# Patient Record
Sex: Female | Born: 1980 | Race: White | Hispanic: Yes | Marital: Married | Smoking: Never smoker
Health system: Southern US, Community
[De-identification: ages and names within clinical notes are randomized; demographics above are authoritative.]

## PROBLEM LIST (undated history)

## (undated) DIAGNOSIS — R87619 Unspecified abnormal cytological findings in specimens from cervix uteri: Secondary | ICD-10-CM

## (undated) DIAGNOSIS — Z87440 Personal history of urinary (tract) infections: Secondary | ICD-10-CM

## (undated) DIAGNOSIS — O24419 Gestational diabetes mellitus in pregnancy, unspecified control: Secondary | ICD-10-CM

## (undated) DIAGNOSIS — IMO0002 Reserved for concepts with insufficient information to code with codable children: Secondary | ICD-10-CM

## (undated) DIAGNOSIS — R011 Cardiac murmur, unspecified: Secondary | ICD-10-CM

## (undated) DIAGNOSIS — R87629 Unspecified abnormal cytological findings in specimens from vagina: Secondary | ICD-10-CM

## (undated) HISTORY — DX: Cardiac murmur, unspecified: R01.1

## (undated) HISTORY — DX: Gestational diabetes mellitus in pregnancy, unspecified control: O24.419

## (undated) HISTORY — DX: Personal history of urinary (tract) infections: Z87.440

## (undated) HISTORY — DX: Unspecified abnormal cytological findings in specimens from vagina: R87.629

## (undated) HISTORY — PX: NO PAST SURGERIES: SHX2092

---

## 2011-06-25 NOTE — L&D Delivery Note (Signed)
Delivery Note At 3:08 AM a viable female was delivered via Vaginal, Spontaneous Delivery (Presentation: Right Occiput Anterior).  APGAR: 8, ; weight .   Placenta status: Intact, Spontaneous.  Cord: 3 vessels with the following complications: None.   Anesthesia: None  Episiotomy: None Lacerations: None Suture Repair: 2.0 vicryl Est. Blood Loss (mL): 500  Mom to postpartum.  Baby to mother's skin and stable.  Felix Pacini 04/14/2012, 3:42 AM

## 2011-06-25 NOTE — L&D Delivery Note (Signed)
Attestation of Attending Supervision of Advanced Practitioner (CNM/NP): Evaluation and management procedures were performed by the Advanced Practitioner under my supervision and collaboration.  I have reviewed the Advanced Practitioner's note and chart, and I agree with the management and plan.  Aven Christen 04/14/2012 5:25 AM

## 2011-12-04 ENCOUNTER — Other Ambulatory Visit (HOSPITAL_COMMUNITY): Payer: Self-pay | Admitting: Physician Assistant

## 2011-12-04 DIAGNOSIS — Z3689 Encounter for other specified antenatal screening: Secondary | ICD-10-CM

## 2011-12-04 LAB — OB RESULTS CONSOLE ABO/RH

## 2011-12-04 LAB — HEMOGLOBIN EVAL RFX ELECTROPHORESIS: Hemoglobin Evaluation: NORMAL

## 2011-12-04 LAB — OB RESULTS CONSOLE ANTIBODY SCREEN: Antibody Screen: NEGATIVE

## 2011-12-04 LAB — OB RESULTS CONSOLE VARICELLA ZOSTER ANTIBODY, IGG: Varicella: IMMUNE

## 2011-12-04 LAB — OB RESULTS CONSOLE RUBELLA ANTIBODY, IGM: Rubella: IMMUNE

## 2011-12-12 ENCOUNTER — Encounter (HOSPITAL_COMMUNITY): Payer: Self-pay

## 2011-12-12 ENCOUNTER — Ambulatory Visit (HOSPITAL_COMMUNITY)
Admission: RE | Admit: 2011-12-12 | Discharge: 2011-12-12 | Disposition: A | Discharge status: Home / Self Care | Payer: Medicaid Other | Source: Ambulatory Visit | Attending: Physician Assistant | Admitting: Physician Assistant

## 2011-12-12 DIAGNOSIS — Z3689 Encounter for other specified antenatal screening: Secondary | ICD-10-CM

## 2011-12-12 DIAGNOSIS — O358XX0 Maternal care for other (suspected) fetal abnormality and damage, not applicable or unspecified: Secondary | ICD-10-CM | POA: Insufficient documentation

## 2011-12-12 DIAGNOSIS — Z1389 Encounter for screening for other disorder: Secondary | ICD-10-CM | POA: Insufficient documentation

## 2011-12-12 DIAGNOSIS — Z363 Encounter for antenatal screening for malformations: Secondary | ICD-10-CM | POA: Insufficient documentation

## 2012-03-26 LAB — OB RESULTS CONSOLE GC/CHLAMYDIA: Gonorrhea: NEGATIVE

## 2012-04-14 ENCOUNTER — Inpatient Hospital Stay (HOSPITAL_COMMUNITY)
Admission: AD | Admit: 2012-04-14 | Discharge: 2012-04-15 | DRG: 775 | Disposition: A | Discharge status: Home / Self Care | Payer: Medicaid Other | Source: Ambulatory Visit | Attending: Obstetrics and Gynecology | Admitting: Obstetrics and Gynecology

## 2012-04-14 ENCOUNTER — Encounter (HOSPITAL_COMMUNITY): Payer: Self-pay | Admitting: *Deleted

## 2012-04-14 DIAGNOSIS — IMO0001 Reserved for inherently not codable concepts without codable children: Secondary | ICD-10-CM

## 2012-04-14 HISTORY — DX: Reserved for inherently not codable concepts without codable children: IMO0001

## 2012-04-14 HISTORY — DX: Unspecified abnormal cytological findings in specimens from cervix uteri: R87.619

## 2012-04-14 HISTORY — DX: Reserved for concepts with insufficient information to code with codable children: IMO0002

## 2012-04-14 LAB — TYPE AND SCREEN
ABO/RH(D): O POS
Antibody Screen: NEGATIVE

## 2012-04-14 LAB — CBC
Hemoglobin: 11.8 g/dL — ABNORMAL LOW (ref 12.0–15.0)
MCHC: 33.3 g/dL (ref 30.0–36.0)
Platelets: 199 10*3/uL (ref 150–400)
RDW: 13.9 % (ref 11.5–15.5)

## 2012-04-14 LAB — RPR: RPR Ser Ql: NONREACTIVE

## 2012-04-14 MED ORDER — LIDOCAINE HCL (PF) 1 % IJ SOLN
INTRAMUSCULAR | Status: AC
  Filled 2012-04-14: qty 30

## 2012-04-14 MED ORDER — DIPHENHYDRAMINE HCL 25 MG PO CAPS: 25.0000 mg | ORAL_CAPSULE | Freq: Four times a day (QID) | ORAL | Status: DC | PRN

## 2012-04-14 MED ORDER — DIBUCAINE 1 % RE OINT: 1.0000 "application " | TOPICAL_OINTMENT | RECTAL | Status: DC | PRN

## 2012-04-14 MED ORDER — TETANUS-DIPHTH-ACELL PERTUSSIS 5-2.5-18.5 LF-MCG/0.5 IM SUSP: 0.5000 mL | Freq: Once | INTRAMUSCULAR | Status: DC

## 2012-04-14 MED ORDER — LACTATED RINGERS IV SOLN
INTRAVENOUS | Status: DC
  Administered 2012-04-14: 03:00:00 via INTRAVENOUS

## 2012-04-14 MED ORDER — OXYTOCIN 40 UNITS IN LACTATED RINGERS INFUSION - SIMPLE MED
INTRAVENOUS | Status: AC
  Filled 2012-04-14: qty 1000

## 2012-04-14 MED ORDER — ONDANSETRON HCL 4 MG PO TABS: 4.0000 mg | ORAL_TABLET | ORAL | Status: DC | PRN

## 2012-04-14 MED ORDER — IBUPROFEN 600 MG PO TABS
600.0000 mg | ORAL_TABLET | Freq: Four times a day (QID) | ORAL | Status: DC
  Administered 2012-04-14 – 2012-04-15 (×4): 600 mg via ORAL
  Filled 2012-04-14 (×3): qty 1

## 2012-04-14 MED ORDER — OXYCODONE-ACETAMINOPHEN 5-325 MG PO TABS
1.0000 | ORAL_TABLET | ORAL | Status: DC | PRN
  Administered 2012-04-14 – 2012-04-15 (×2): 1 via ORAL
  Filled 2012-04-14 (×2): qty 1

## 2012-04-14 MED ORDER — OXYCODONE-ACETAMINOPHEN 5-325 MG PO TABS
1.0000 | ORAL_TABLET | ORAL | Status: DC | PRN
  Administered 2012-04-14: 1 via ORAL
  Filled 2012-04-14: qty 1

## 2012-04-14 MED ORDER — BENZOCAINE-MENTHOL 20-0.5 % EX AERO
1.0000 "application " | INHALATION_SPRAY | CUTANEOUS | Status: DC | PRN
  Administered 2012-04-14: 1 via TOPICAL
  Filled 2012-04-14 (×2): qty 56

## 2012-04-14 MED ORDER — ZOLPIDEM TARTRATE 5 MG PO TABS: 5.0000 mg | ORAL_TABLET | Freq: Every evening | ORAL | Status: DC | PRN

## 2012-04-14 MED ORDER — ACETAMINOPHEN 325 MG PO TABS: 650.0000 mg | ORAL_TABLET | ORAL | Status: DC | PRN

## 2012-04-14 MED ORDER — CITRIC ACID-SODIUM CITRATE 334-500 MG/5ML PO SOLN: 30.0000 mL | ORAL | Status: DC | PRN

## 2012-04-14 MED ORDER — SIMETHICONE 80 MG PO CHEW: 80.0000 mg | CHEWABLE_TABLET | ORAL | Status: DC | PRN

## 2012-04-14 MED ORDER — OXYTOCIN 40 UNITS IN LACTATED RINGERS INFUSION - SIMPLE MED: 62.5000 mL/h | INTRAVENOUS | Status: DC

## 2012-04-14 MED ORDER — MISOPROSTOL 200 MCG PO TABS
ORAL_TABLET | ORAL | Status: AC
  Administered 2012-04-14: 1000 ug
  Filled 2012-04-14: qty 5

## 2012-04-14 MED ORDER — LANOLIN HYDROUS EX OINT: TOPICAL_OINTMENT | CUTANEOUS | Status: DC | PRN

## 2012-04-14 MED ORDER — IBUPROFEN 600 MG PO TABS
600.0000 mg | ORAL_TABLET | Freq: Four times a day (QID) | ORAL | Status: DC | PRN
  Administered 2012-04-14: 600 mg via ORAL
  Filled 2012-04-14: qty 1

## 2012-04-14 MED ORDER — SENNOSIDES-DOCUSATE SODIUM 8.6-50 MG PO TABS
2.0000 | ORAL_TABLET | Freq: Every day | ORAL | Status: DC
  Administered 2012-04-14: 2 via ORAL

## 2012-04-14 MED ORDER — ONDANSETRON HCL 4 MG/2ML IJ SOLN: 4.0000 mg | Freq: Four times a day (QID) | INTRAMUSCULAR | Status: DC | PRN

## 2012-04-14 MED ORDER — LIDOCAINE HCL (PF) 1 % IJ SOLN: 30.0000 mL | INTRAMUSCULAR | Status: DC | PRN

## 2012-04-14 MED ORDER — MISOPROSTOL 200 MCG PO TABS: 1000.0000 ug | ORAL_TABLET | Freq: Once | ORAL | Status: DC

## 2012-04-14 MED ORDER — LACTATED RINGERS IV SOLN
500.0000 mL | INTRAVENOUS | Status: DC | PRN
  Administered 2012-04-14: 500 mL via INTRAVENOUS

## 2012-04-14 MED ORDER — PRENATAL MULTIVITAMIN CH
1.0000 | ORAL_TABLET | Freq: Every day | ORAL | Status: DC
  Administered 2012-04-14 – 2012-04-15 (×2): 1 via ORAL
  Filled 2012-04-14 (×2): qty 1

## 2012-04-14 MED ORDER — ONDANSETRON HCL 4 MG/2ML IJ SOLN: 4.0000 mg | INTRAMUSCULAR | Status: DC | PRN

## 2012-04-14 MED ORDER — WITCH HAZEL-GLYCERIN EX PADS: 1.0000 "application " | MEDICATED_PAD | CUTANEOUS | Status: DC | PRN

## 2012-04-14 MED ORDER — OXYTOCIN BOLUS FROM INFUSION
500.0000 mL | INTRAVENOUS | Status: DC
  Administered 2012-04-14: 500 mL via INTRAVENOUS
  Filled 2012-04-14 (×23): qty 500

## 2012-04-14 NOTE — H&P (Signed)
Attestation of Attending Supervision of Advanced Practitioner (CNM/NP): Evaluation and management procedures were performed by the Advanced Practitioner under my supervision and collaboration.  I have reviewed the Advanced Practitioner's note and chart, and I agree with the management and plan.  Azjah Pardo 04/14/2012 5:25 AM   

## 2012-04-14 NOTE — MAU Note (Signed)
PT ARRIVES WITH UC'S.  HAD VE 2 WEEKS AGO- ALL FINE.   USED INTERPRETER - DEBBIE

## 2012-04-14 NOTE — Progress Notes (Signed)
UR Chart review completed.  

## 2012-04-14 NOTE — H&P (Signed)
Destiny Cooper is a 31 y.o. female G3P2 [redacted]w[redacted]d presenting for painful contractions that started at 1:30 this morning. She denies leaking or gush of fluid, no vaginal bleeding. She had late prenatal care with this pregnancy. She denies complications with this pregnancy or her two previous pregnancies and deliveries, both were SVD. She had 7 pound babies with both her pregnancies and feels this one is about the same. Maternal Medical History:  Reason for admission: Reason for admission: contractions and nausea.  Contractions: Onset was 1-2 hours ago.   Frequency: regular.   Perceived severity is strong.    Fetal activity: Perceived fetal activity is normal.   Last perceived fetal movement was within the past hour.      OB History    Grav Para Term Preterm Abortions TAB SAB Ect Mult Living   3 2        2      Past Medical History  Diagnosis Date  . No pertinent past medical history    Past Surgical History  Procedure Date  . No past surgeries    Family History: family history is not on file. Social History:  reports that she has never smoked. She does not have any smokeless tobacco history on file. She reports that she does not use illicit drugs. Her alcohol history not on file.   Prenatal Transfer Tool  Maternal Diabetes: No Genetic Screening: Declined Maternal Ultrasounds/Referrals: Normal Fetal Ultrasounds or other Referrals:  None Maternal Substance Abuse:  No Significant Maternal Medications:  None Significant Maternal Lab Results:  Lab values include: Group B Strep negative Other Comments:  late prenatal care, started in June.  Review of Systems  Constitutional: Negative for fever and chills.  Eyes: Negative for blurred vision and double vision.  Respiratory: Negative for shortness of breath.   Cardiovascular: Negative for chest pain and palpitations.  Gastrointestinal: Positive for nausea. Negative for vomiting.  Genitourinary: Negative.   Musculoskeletal: Negative.    Neurological: Negative.  Negative for weakness and headaches.    Dilation: 9 Effacement (%): 100 Exam by:: Lucy Chris RNC, [facility] NP Blood pressure 115/65, pulse 70, temperature 97 F (36.1 C), temperature source Oral, resp. rate 18, height 5\' 2"  (1.575 m), weight 71.668 kg (158 lb), last menstrual period 07/18/2011. Maternal Exam:  Uterine Assessment: Contraction strength is firm.  Abdomen: Patient reports no abdominal tenderness. Fetal presentation: vertex  Introitus: Normal vulva. Normal vagina.  Vagina is negative for discharge.  Pelvis: adequate for delivery.   Cervix: Cervix evaluated by digital exam.     Fetal Exam Fetal Monitor Review: Variability: moderate (6-25 bpm).   Pattern: accelerations present and no decelerations.    Fetal State Assessment: Category I - tracings are normal.     Physical Exam  Constitutional: She is oriented to person, place, and time. She appears well-developed and well-nourished. She appears distressed.  HENT:  Head: Normocephalic and atraumatic.  Cardiovascular: Normal rate, regular rhythm and normal heart sounds.   No murmur heard. Respiratory: Effort normal and breath sounds normal. No respiratory distress. She has no wheezes. She has no rales.  GI:       gravid  Genitourinary: Vagina normal and uterus normal. No vaginal discharge found.  Musculoskeletal: Normal range of motion. She exhibits no edema and no tenderness.  Neurological: She is alert and oriented to person, place, and time.  Skin: Skin is warm and dry.  Psychiatric: She has a normal mood and affect.     Prenatal labs: ABO, Rh:  O/Positive/-- (06/12 0000) Antibody: Negative (06/12 0000) Rubella: Immune (06/12 0000) RPR: Nonreactive (06/12 0000)  HBsAg: Negative (06/12 0000)  HIV: Non-reactive (06/12 0000)  GBS: Negative (10/16 0000)   Assessment/Plan: 1. Term gestation, Active labor 2. Transfer to LD with routine orders 3. GBS negative 4. Anticipate  NSVD   Felix Pacini 04/14/2012, 3:26 AM

## 2012-04-15 MED ORDER — NORETHINDRONE 0.35 MG PO TABS: 1.0000 | ORAL_TABLET | Freq: Every day | ORAL | Status: DC

## 2012-04-15 MED ORDER — WITCH HAZEL-GLYCERIN EX PADS: 1.0000 "application " | MEDICATED_PAD | CUTANEOUS | Status: DC | PRN

## 2012-04-15 MED ORDER — IBUPROFEN 600 MG PO TABS: 600.0000 mg | ORAL_TABLET | Freq: Four times a day (QID) | ORAL | Status: DC

## 2012-04-15 MED ORDER — BENZOCAINE-MENTHOL 20-0.5 % EX AERO: 1.0000 "application " | INHALATION_SPRAY | CUTANEOUS | Status: DC | PRN

## 2012-04-15 MED ORDER — LANOLIN HYDROUS EX OINT: 1.0000 "application " | TOPICAL_OINTMENT | CUTANEOUS | Status: DC | PRN

## 2012-04-15 MED ORDER — DIBUCAINE 1 % RE OINT: 1.0000 "application " | TOPICAL_OINTMENT | RECTAL | Status: DC | PRN

## 2012-04-15 NOTE — Discharge Summary (Signed)
Attestation of Attending Supervision of Advanced Practitioner (CNM/NP): Evaluation and management procedures were performed by the Advanced Practitioner under my supervision and collaboration.  I have reviewed the Advanced Practitioner's note and chart, and I agree with the management and plan.  Halana Deisher, MD, FACOG Attending Obstetrician & Gynecologist Faculty Practice, [facility]  

## 2012-04-15 NOTE — Discharge Summary (Signed)
Obstetric Discharge Summary Reason for Admission: onset of labor Prenatal Procedures: ultrasound Intrapartum Procedures: spontaneous vaginal delivery Postpartum Procedures: none Complications-Operative and Postpartum: none Hemoglobin  Date Value Range Status  04/14/2012 11.8* 12.0 - 15.0 g/dL Final     HCT  Date Value Range Status  04/14/2012 35.4* 36.0 - 46.0 % Final    Physical Exam:  General: alert and cooperative Lochia: appropriate Uterine Fundus: firm Incision: none DVT Evaluation: No evidence of DVT seen on physical exam. Negative Homan's sign. No cords or calf tenderness.  Discharge Diagnoses: Term Pregnancy-delivered  Discharge Information: Date: 04/15/2012 Activity: pelvic rest Diet:routine, breastfeeding mother Medications: Ibuprofen Condition: stable Instructions: AVS Discharge to: home  Follow-up Information    Schedule an appointment as soon as possible for a visit in 6 weeks to follow up. (Make an appt when you get home for 6 weeks with your OB)          Newborn Data: Live born female  Birth Weight: 8 lb 10.5 oz (3925 g) APGAR: 8, 9  Home with mother.  Felix Pacini 04/15/2012, 6:17 AM  I have seen and examined this patient and I agree with the above. Cam Hai 7:38 AM 04/15/2012

## 2013-06-24 NOTE — L&D Delivery Note (Signed)
Delivery Note ,At 9:35 PM a viable and healthy female was delivered via Vaginal, Spontaneous Delivery in pt's home.  Pt was brought in by EMS who determined APGAR's and clamped and cut cord.  Placenta was delivered on arrival to hospital by myself.  (Presentation: unknown ).  APGAR: 7,9 ; weight: pending Placenta status: Intact, Spontaneous.  Cord: 3 vessels with the following complications: none.    Anesthesia: None  Episiotomy: None Lacerations: None Suture Repair: N/A Est. Blood Loss (mL):   Mom to postpartum.  Baby to Couplet care / Skin to Skin.  Benjamin Stainhompson, McKenzie L, MD 06/13/2014, 11:42 PM  OB fellow attestation:  I have seen and examined this patient; I agree with above documentation in the resident's note. I was present for and actively assisted with the entirety of the procedure as documented above. Baby was delivered at home. Placenta was delivered in MAU, intact. No lacerations noted. EBL 200, patient stable.   William DaltonMcEachern, Leavy Heatherly, MD 12:06 AM

## 2014-02-10 ENCOUNTER — Other Ambulatory Visit (HOSPITAL_COMMUNITY): Payer: Self-pay | Admitting: Physician Assistant

## 2014-02-10 DIAGNOSIS — Z3689 Encounter for other specified antenatal screening: Secondary | ICD-10-CM

## 2014-02-14 ENCOUNTER — Ambulatory Visit (HOSPITAL_COMMUNITY)
Admission: RE | Admit: 2014-02-14 | Discharge: 2014-02-14 | Disposition: A | Discharge status: Home / Self Care | Payer: Medicaid Other | Source: Ambulatory Visit | Attending: Physician Assistant | Admitting: Physician Assistant

## 2014-02-14 DIAGNOSIS — Z1389 Encounter for screening for other disorder: Secondary | ICD-10-CM | POA: Insufficient documentation

## 2014-02-14 DIAGNOSIS — Z3689 Encounter for other specified antenatal screening: Secondary | ICD-10-CM

## 2014-02-14 DIAGNOSIS — Z363 Encounter for antenatal screening for malformations: Secondary | ICD-10-CM | POA: Insufficient documentation

## 2014-04-25 ENCOUNTER — Encounter (HOSPITAL_COMMUNITY): Payer: Self-pay | Admitting: *Deleted

## 2014-05-26 LAB — OB RESULTS CONSOLE GBS: GBS: NEGATIVE

## 2014-06-13 ENCOUNTER — Inpatient Hospital Stay (HOSPITAL_COMMUNITY)
Admission: AD | Admit: 2014-06-13 | Discharge: 2014-06-15 | DRG: 769 | Disposition: A | Discharge status: Home / Self Care | Payer: Medicaid Other | Source: Ambulatory Visit | Attending: Obstetrics & Gynecology | Admitting: Obstetrics & Gynecology

## 2014-06-13 ENCOUNTER — Encounter (HOSPITAL_COMMUNITY): Payer: Self-pay

## 2014-06-13 MED ORDER — OXYTOCIN 40 UNITS IN LACTATED RINGERS INFUSION - SIMPLE MED: 250.0000 mL/h | Freq: Once | INTRAVENOUS | Status: DC

## 2014-06-13 MED ORDER — OXYTOCIN 40 UNITS IN LACTATED RINGERS INFUSION - SIMPLE MED
INTRAVENOUS | Status: AC
  Administered 2014-06-13: 40 [IU]
  Filled 2014-06-13: qty 1000

## 2014-06-13 MED ORDER — IBUPROFEN 600 MG PO TABS
600.0000 mg | ORAL_TABLET | Freq: Four times a day (QID) | ORAL | Status: AC | PRN
  Administered 2014-06-14: 600 mg via ORAL
  Filled 2014-06-13: qty 1

## 2014-06-13 NOTE — H&P (Cosign Needed)
Destiny Cooper is a 33 y.o. [phone] 5843w2d female presenting for delivery of baby at home.  Pt states she believes she started contracting around 9pm only a few minutes after her water broke which she described as clear fluid, and then husband states that the baby was born at approximately 9:30pm at which time he called EMS.  EMS arrived to the MAU around 10:30pm.  The placenta was promptly delivered intact, pt had no complications, and was in moderate pain.      Maternal Medical History:  Prenatal complications: No bleeding, cholelithiasis, HIV, PIH, infection, IUGR, nephrolithiasis, oligohydramnios, placental abnormality, polyhydramnios, pre-eclampsia, preterm labor, substance abuse, thrombocytopenia or thrombophilia.   Prenatal Complications - Diabetes: none.    OB History    Gravida Para Term Preterm AB TAB SAB Ectopic Multiple Living   4 3 1       3      Past Medical History  Diagnosis Date  . Abnormal Pap smear    Past Surgical History  Procedure Laterality Date  . No past surgeries     Family History: family history is not on file. Social History:  reports that she has never smoked. She does not have any smokeless tobacco history on file. She reports that she does not use illicit drugs. Her alcohol history is not on file.   Prenatal Transfer Tool  Maternal Diabetes: No Genetic Screening: Normal Maternal Ultrasounds/Referrals: Normal Fetal Ultrasounds or other Referrals:  None Maternal Substance Abuse:  No Significant Maternal Medications:  None Significant Maternal Lab Results:  None Other Comments:  None  Review of Systems  Constitutional: Negative for fever and chills.  Eyes: Negative.   Respiratory: Negative.   Cardiovascular: Negative.   Gastrointestinal: Negative.   Genitourinary: Negative.   Musculoskeletal: Negative.   Skin: Negative.   Neurological: Negative.   Endo/Heme/Allergies: Negative.   Psychiatric/Behavioral: Negative.       Blood pressure  118/71, pulse 79, resp. rate 18, unknown if currently breastfeeding. Maternal Exam:  Introitus: Amniotic fluid character: clear.  Pelvis: adequate for delivery.   Cervix: not evaluated.   Physical Exam  Constitutional: She is oriented to person, place, and time. She appears well-developed and well-nourished.  HENT:  Head: Normocephalic and atraumatic.  Eyes: Conjunctivae and EOM are normal. Pupils are equal, round, and reactive to light.  Neck: Normal range of motion. Neck supple.  Cardiovascular: Normal rate and regular rhythm.   Respiratory: Effort normal.  Genitourinary: Vagina normal and uterus normal.  Pt had umbilical cord that was clamped. Once visualized was delivered at approximately 22:45. Uterus was firm on external palpation.  No perineal laceration.  No postpartum hemorrhage.    Musculoskeletal: Normal range of motion.  Neurological: She is alert and oriented to person, place, and time. She has normal reflexes.  Skin: Skin is warm and dry.  Psychiatric: She has a normal mood and affect.    Prenatal labs: ABO, Rh:   Antibody:   Rubella:   RPR:    HBsAg:    HIV:    GBS: Negative (12/03 0000)   Assessment/Plan: Pt is a 33 y.o. [id number] at  7943w2d who presents with delivered baby girl at home.  Pt arrived via EMS with placenta still undelivered.  Placenta was delivered in MAU at approximately 10:45 and IV Pitocin bolus was begun.  Pt will be admitted to Mother baby with couplet care.  Post partum care per routine order set.    Benjamin Stainhompson, McKenzie L, MD 06/13/2014, 11:24 PM   OB  fellow attestation:  I have seen and examined this patient; I agree with above documentation in the resident's note.   Destiny Cooper is a 33 y.o. now [id number] brought in by EMS after delivering a viable female infant at home. Placenta delivered in the MAU as documented in delivery summary. No lacerations noted. Prenatal records from health department reviewed, pregnancy only complicated by  abnormal 1 hour gtt, normal 3 hr.   PE: BP 115/67 mmHg  Pulse 76  Resp 18  Breastfeeding? Unknown Gen: calm comfortable, NAD Resp: normal effort, no distress Abd: uterus firm  ROS, labs, PMH reviewed  Plan: 1. Active Labor at Term status post NSVD at home.  - Immediate post delivery care in MAU.  - CBC, Type + Screen, RPR - Routine post partum care once stable.   William DaltonMcEachern, Lennell Shanks, MD 12:01 AM

## 2014-06-13 NOTE — MAU Note (Signed)
Pt arrived via EMS . Delivered at home approx at 2135. Infant with pt . wrapped in thermal blanket.. Placenta not delivered yet. Pt calm comfortable and V/SS.

## 2014-06-14 LAB — HIV ANTIBODY (ROUTINE TESTING W REFLEX): HIV: NONREACTIVE

## 2014-06-14 MED ORDER — OXYCODONE-ACETAMINOPHEN 5-325 MG PO TABS: 2.0000 | ORAL_TABLET | ORAL | Status: DC | PRN

## 2014-06-14 MED ORDER — DIBUCAINE 1 % RE OINT: 1.0000 "application " | TOPICAL_OINTMENT | RECTAL | Status: DC | PRN

## 2014-06-14 MED ORDER — SIMETHICONE 80 MG PO CHEW: 80.0000 mg | CHEWABLE_TABLET | ORAL | Status: DC | PRN

## 2014-06-14 MED ORDER — SENNOSIDES-DOCUSATE SODIUM 8.6-50 MG PO TABS
2.0000 | ORAL_TABLET | ORAL | Status: DC
  Administered 2014-06-15: 2 via ORAL
  Filled 2014-06-14: qty 2

## 2014-06-14 MED ORDER — LANOLIN HYDROUS EX OINT: TOPICAL_OINTMENT | CUTANEOUS | Status: DC | PRN

## 2014-06-14 MED ORDER — TETANUS-DIPHTH-ACELL PERTUSSIS 5-2.5-18.5 LF-MCG/0.5 IM SUSP: 0.5000 mL | Freq: Once | INTRAMUSCULAR | Status: DC

## 2014-06-14 MED ORDER — DIPHENHYDRAMINE HCL 25 MG PO CAPS: 25.0000 mg | ORAL_CAPSULE | Freq: Four times a day (QID) | ORAL | Status: DC | PRN

## 2014-06-14 MED ORDER — OXYCODONE-ACETAMINOPHEN 5-325 MG PO TABS: 1.0000 | ORAL_TABLET | ORAL | Status: DC | PRN

## 2014-06-14 MED ORDER — ZOLPIDEM TARTRATE 5 MG PO TABS: 5.0000 mg | ORAL_TABLET | Freq: Every evening | ORAL | Status: DC | PRN

## 2014-06-14 MED ORDER — IBUPROFEN 600 MG PO TABS
600.0000 mg | ORAL_TABLET | Freq: Four times a day (QID) | ORAL | Status: DC
  Administered 2014-06-14 – 2014-06-15 (×7): 600 mg via ORAL
  Filled 2014-06-14 (×7): qty 1

## 2014-06-14 MED ORDER — ONDANSETRON HCL 4 MG PO TABS: 4.0000 mg | ORAL_TABLET | ORAL | Status: DC | PRN

## 2014-06-14 MED ORDER — BENZOCAINE-MENTHOL 20-0.5 % EX AERO
1.0000 "application " | INHALATION_SPRAY | CUTANEOUS | Status: DC | PRN
  Administered 2014-06-14: 1 via TOPICAL
  Filled 2014-06-14: qty 56

## 2014-06-14 MED ORDER — PRENATAL MULTIVITAMIN CH
1.0000 | ORAL_TABLET | Freq: Every day | ORAL | Status: DC
  Administered 2014-06-14 – 2014-06-15 (×2): 1 via ORAL
  Filled 2014-06-14 (×2): qty 1

## 2014-06-14 MED ORDER — ONDANSETRON HCL 4 MG/2ML IJ SOLN: 4.0000 mg | INTRAMUSCULAR | Status: DC | PRN

## 2014-06-14 MED ORDER — WITCH HAZEL-GLYCERIN EX PADS: 1.0000 "application " | MEDICATED_PAD | CUTANEOUS | Status: DC | PRN

## 2014-06-14 NOTE — Progress Notes (Signed)
I assisted CongoLeigha Rn with Destiny Cooper,by Destiny Cooper Interpreter, I ordered her breakfast.

## 2014-06-14 NOTE — Progress Notes (Signed)
UR chart review completed.  

## 2014-06-14 NOTE — Progress Notes (Signed)
I assisted Levon HedgerKathy RN, Radio producerKim RN, with some questions about the baby and more information since she deliver her baby at home. By Orlan LeavensViria Alvarez, Interpreter

## 2014-06-14 NOTE — Progress Notes (Signed)
I stopped by to check on patient's needs.  Destiny Cooper Interpreter. °

## 2014-06-14 NOTE — Lactation Note (Addendum)
This note was copied from the chart of Destiny Cooper. Lactation Consultation Note  Patient Name: Destiny Cooper Today's Date: 06/14/2014 Reason for consult: Initial assessment per mom experienced breast feeder of 2 other babies.  Baby is 17 hours old , and has been to breast several times . @ consult baby showing feeding cues. LC and orienting RN , assisted with positing and depth. Depth achieved and multiply swallows noted on both breast. Per mom comfortable with both breast.and nipple appears normal when baby released from the breast. Mother informed of post-discharge support and given phone number to the lactation department, including services for phone call assistance; out-patient appointments; and breastfeeding support group. List of other breastfeeding resources in the community given in the handout. Encouraged mother to call for problems or concerns related to breastfeeding. [facility]  Per mom felt she didn't need a spanish interpreter. Mom was able  To answer all questions appropriately in [place].   Maternal Data Has patient been taught Hand Expression?: Yes Does the patient have breastfeeding experience prior to this delivery?: Yes  Feeding Feeding Type: Breast Fed Length of feed: 15 min  LATCH Score/Interventions Latch: Grasps breast easily, tongue down, lips flanged, rhythmical sucking.  Audible Swallowing: Spontaneous and intermittent Intervention(s): Skin to skin;Hand expression  Type of Nipple: Everted at rest and after stimulation  Comfort (Breast/Nipple): Soft / non-tender     Hold (Positioning): Assistance needed to correctly position infant at breast and maintain latch. Intervention(s): Breastfeeding basics reviewed;Support Pillows;Position options;Skin to skin  LATCH Score: 9  Lactation Tools Discussed/Used WIC Program: Yes (per mom [place] )   Consult Status Consult Status: Follow-up Date:  06/15/14 Follow-up type: In-patient    Kathrin Greathouseorio, Elison Worrel Ann 06/14/2014, 3:26 PM

## 2014-06-14 NOTE — Progress Notes (Signed)
Post Partum Day 1 Subjective: no complaints, up ad lib, voiding, tolerating PO and + flatus  Objective: Blood pressure 111/74, pulse 60, temperature 98.4 F (36.9 C), temperature source Oral, resp. rate 18, SpO2 99 %, unknown if currently breastfeeding.  Physical Exam:  General: alert, cooperative, appears stated age and no distress Lochia: appropriate Uterine Fundus: firm Incision: N/A DVT Evaluation: No evidence of DVT seen on physical exam. Negative Homan's sign.  No results for input(s): HGB, HCT in the last 72 hours.  Assessment/Plan: Plan for discharge tomorrow, Discharge home, Breastfeeding and Contraception husband plans to get vasectomy   LOS: 1 day   Destiny Cooper, Destiny Cooper 06/14/2014, 6:54 AM   OB fellow attestation Post Partum Day 1 I have seen and examined this patient and agree with above documentation in the resident's note.   Destiny Cooper is a 33 y.o. [id number] s/p NSVD.  Pt denies problems with ambulating, voiding or po intake. Pain is well controlled.  Plan for birth control is vasectomy.  Method of Feeding: breast/bottle  PE:  BP 111/74 mmHg  Pulse 60  Temp(Src) 98.4 F (36.9 C) (Oral)  Resp 18  SpO2 99%  Breastfeeding? Unknown Fundus firm  Plan for discharge: PPD#2  William DaltonMcEachern, Gregory Dowe, MD 7:13 AM

## 2014-06-15 LAB — RPR

## 2014-06-15 NOTE — Discharge Instructions (Signed)

## 2014-06-15 NOTE — Progress Notes (Signed)
I stopped by  Patients room with Tia RN, to check on her needs, By Orlan LeavensViria Alvarez Interpreter.

## 2014-06-15 NOTE — Lactation Note (Addendum)
This note was copied from the chart of Destiny Cooper. Lactation Consultation Note  Patient Name: Destiny Cooper WUJWJ'XToday's Date: 06/15/2014 Reason for consult: Follow-up assessment Baby 37 hours of life. Mom nursing baby [facility]. Baby is on phototherapy. Mom experienced BF, states that she nursed and gave bottles to two older children. Baby latched deeply with intermittent swallows noted. Enc mom to nurse often. Enc mom to call out for assistance with nursing as needed.   Maternal Data    Feeding Feeding Type:  (Mom breastfeeding when LC entered room. ) Length of feed: 10 min  LATCH Score/Interventions Latch: Grasps breast easily, tongue down, lips flanged, rhythmical sucking.  Audible Swallowing: A few with stimulation Intervention(s): Skin to skin  Type of Nipple: Everted at rest and after stimulation  Comfort (Breast/Nipple): Soft / non-tender  Problem noted: Mild/Moderate discomfort  Hold (Positioning): No assistance needed to correctly position infant at breast.  LATCH Score: 9  Lactation Tools Discussed/Used     Consult Status Consult Status: Follow-up Date: 06/16/14 Follow-up type: In-patient    Geralynn OchsWILLIARD, Sladen Plancarte 06/15/2014, 11:31 AM

## 2014-06-15 NOTE — Discharge Summary (Signed)
Obstetric Discharge Summary Reason for Admission: s/p vag delivery at home Prenatal Procedures: none Intrapartum Procedures: spontaneous vaginal delivery Postpartum Procedures: none Complications-Operative and Postpartum: none HEMOGLOBIN  Date Value Ref Range Status  04/14/2012 11.8* 12.0 - 15.0 g/dL Final   HCT  Date Value Ref Range Status  04/14/2012 35.4* 36.0 - 46.0 % Final   [name] is a 33yo [id number] presenting s/p vag del at home on 12/21 at 39.2wks. Her ctx began shortly after her water breaking, and the infant delivered within 30 mins of the ctx starting. The umbilical cord had been clamped and cut upon her arrival, and the placenta was delivered in MAU and vag inspection revealed no lacs. By PPD#2 she was doing well and ready for discharge. Infant is under bili-lights. Breastfeeding is going well and there is discussion of her husband receiving a vasectomy.  Physical Exam:  General: alert, cooperative and no distress  Heart: RRR Lungs: nl effort Lochia: appropriate Uterine Fundus: firm DVT Evaluation: No evidence of DVT seen on physical exam.  Discharge Diagnoses: Term Pregnancy-delivered  Discharge Information: Date: 06/15/2014 Activity: pelvic rest Diet: routine Medications: PNV and Ibuprofen Condition: stable Instructions: refer to practice specific booklet Discharge to: home Follow-up Information    [facility] [place]. Schedule an appointment as soon as possible for a visit in 6 weeks.   Why:  For your postpartum appointment.   Contact information:   [address] [place] [phone]      Newborn Data: Live born female  Birth Weight: 8 lb 9.4 oz (3895 g) APGAR: ,   Home with mother.  Cam HaiSHAW, Saylor Sheckler CNM 06/15/2014, 9:46 AM

## 2014-06-15 NOTE — Progress Notes (Signed)
I ordered patient's lunch.  Destiny Cooper  Interpreter. °

## 2014-06-15 NOTE — Progress Notes (Signed)
I assisted Tia RN with explanation about photo therapy, by Orlan LeavensViria Alvarez Interpreter

## 2014-06-15 NOTE — Progress Notes (Signed)
I assisted Dr. Ezequiel EssexGable with explanation of care plan for the Baby. Destiny Cooper  Interpreter.

## 2014-06-16 ENCOUNTER — Ambulatory Visit: Payer: Self-pay

## 2014-06-16 NOTE — Lactation Note (Incomplete)
This note was copied from the chart of Destiny Leonarda SalonBlanca Eisenhart. Lactation Consultation Note  Follow up visit made.  Mom states baby last fed 5 hours ago.  Explained to mom that baby should not go more than 3 hours between feedings.  Patient Name: Destiny Cooper WUJWJ'XToday's Date: 06/16/2014 Reason for consult: Follow-up assessment;Hyperbilirubinemia   Maternal Data    Feeding Feeding Type: Breast Fed  LATCH Score/Interventions Latch: Grasps breast easily, tongue down, lips flanged, rhythmical sucking.  Audible Swallowing: Spontaneous and intermittent Intervention(s): Skin to skin;Alternate breast massage  Type of Nipple: Everted at rest and after stimulation  Comfort (Breast/Nipple): Soft / non-tender     Hold (Positioning): No assistance needed to correctly position infant at breast. Intervention(s): Breastfeeding basics reviewed;Support Pillows;Skin to skin  LATCH Score: 10  Lactation Tools Discussed/Used     Consult Status Consult Status: Follow-up Date: 06/17/14 Follow-up type: In-patient    Huston FoleyMOULDEN, Marguis Mathieson S 06/16/2014, 11:45 AM

## 2014-06-17 ENCOUNTER — Ambulatory Visit: Payer: Self-pay

## 2014-06-17 NOTE — Lactation Note (Signed)
This note was copied from the chart of Destiny Destiny Cooper. Lactation Consultation Note  Patient Name: Destiny Cooper RUEAV'WToday's Date: 06/17/2014 Reason for consult: Follow-up assessment Baby 86 hours of life. Offered to call interpreter, mother declined. Mom states baby is nursing well. Mom intends to nurse and give formula. Discussed supply and demand. Mom latched baby  In cradle position, baby latches deeply, suckling rhythmically with intermittent swallows noted. Mom denies nipple pain. Mom states baby will have another blood draw at 2pm for jaundice check. Mom aware to nurse baby often. Mom had just changed a wet and stool diaper, and baby had another stool just after latching onto breast. Mom aware of OP/BFSG and LC phone line assistance after D/C. Referred mom to Baby and Me booklet for number of diapers to expect by day of life and EBM storage guidelines.   Maternal Data    Feeding Feeding Type: Breast Fed Length of feed: 30 min  LATCH Score/Interventions Latch: Grasps breast easily, tongue down, lips flanged, rhythmical sucking.  Audible Swallowing: Spontaneous and intermittent  Type of Nipple: Everted at rest and after stimulation  Comfort (Breast/Nipple): Soft / non-tender     Hold (Positioning): No assistance needed to correctly position infant at breast.  LATCH Score: 10  Lactation Tools Discussed/Used     Consult Status Consult Status: PRN    Geralynn OchsWILLIARD, Joshus Rogan 06/17/2014, 11:52 AM

## 2016-10-03 ENCOUNTER — Ambulatory Visit (INDEPENDENT_AMBULATORY_CARE_PROVIDER_SITE_OTHER): Payer: Self-pay | Admitting: Physician Assistant

## 2016-10-03 ENCOUNTER — Encounter (INDEPENDENT_AMBULATORY_CARE_PROVIDER_SITE_OTHER): Payer: Self-pay | Admitting: Physician Assistant

## 2016-10-03 VITALS — BP 119/77 | HR 81 | Temp 98.2°F | Ht 61.5 in | Wt 145.0 lb

## 2016-10-03 DIAGNOSIS — M546 Pain in thoracic spine: Secondary | ICD-10-CM

## 2016-10-03 LAB — POCT URINALYSIS DIPSTICK
Bilirubin, UA: NEGATIVE
Glucose, UA: NEGATIVE
Ketones, UA: NEGATIVE
Leukocytes, UA: NEGATIVE
NITRITE UA: NEGATIVE
Protein, UA: NEGATIVE
Spec Grav, UA: 1.01 (ref 1.010–1.025)
UROBILINOGEN UA: 0.2 U/dL
pH, UA: 7.5 (ref 5.0–8.0)

## 2016-10-03 MED ORDER — INDOMETHACIN 50 MG PO CAPS: 50.0000 mg | ORAL_CAPSULE | Freq: Two times a day (BID) | ORAL | 0 refills | Status: DC

## 2016-10-03 MED ORDER — TAMSULOSIN HCL 0.4 MG PO CAPS: 0.4000 mg | ORAL_CAPSULE | Freq: Every day | ORAL | 0 refills | Status: DC

## 2016-10-03 NOTE — Progress Notes (Signed)
Subjective:  Patient ID: Destiny Cooper, female    DOB: [birth year]  Age: 36 y.o. MRN: [id number]  CC:  Back pain   HPI Destiny Cooper is a 36 y.o. female with a PMH of renal stones presents with intermittent right sided mid back pain. Right sided mid back pain associated with diaphoresis and pallor. 3-4 episodes over a period of approximately >1 year. Last episode 2 weeks ago. Not associated with activity. Has never had any trauma, no hematuria. Was last seen by a doctor a yrar ago and was told she had an infection and some small stones. Denies any other symptoms.     Review of Systems  Constitutional: Negative for chills, fever and malaise/fatigue.  Eyes: Negative for blurred vision.  Respiratory: Negative for shortness of breath.   Cardiovascular: Negative for chest pain and palpitations.  Gastrointestinal: Negative for abdominal pain and nausea.  Genitourinary: Negative for dysuria and hematuria.  Musculoskeletal: Negative for joint pain and myalgias.  Skin: Negative for rash.  Neurological: Negative for tingling and headaches.  Psychiatric/Behavioral: Negative for depression. The patient is not nervous/anxious.     Objective:  BP 119/77 (BP Location: Left Arm, Patient Position: Sitting, Cuff Size: Normal)   Pulse 81   Temp 98.2 F (36.8 C) (Oral)   Ht 5' 1.5" (1.562 m)   Wt 145 lb (65.8 kg)   LMP 09/28/2016 (Exact Date)   SpO2 98%   Breastfeeding? No   BMI 26.95 kg/m   BP/Weight 10/03/2016 06/15/2014 04/15/2012  Systolic BP 119 99 98  Diastolic BP 77 60 62  Wt. (Lbs) 145 - -  BMI 26.95 - -      Physical Exam  Constitutional: She is oriented to person, place, and time.  Well developed, well nourished, NAD, polite  HENT:  Head: Normocephalic and atraumatic.  Eyes: No scleral icterus.  Neck: Normal range of motion. Neck supple. No thyromegaly present.  Cardiovascular: Normal rate, regular rhythm and normal heart sounds.   Pulmonary/Chest: Effort normal and  breath sounds normal.  Abdominal: Soft. Bowel sounds are normal. There is no tenderness.  Musculoskeletal: She exhibits no edema.  No CVA tenderness bilaterally  Neurological: She is alert and oriented to person, place, and time.  Skin: Skin is warm and dry. No rash noted. No erythema. No pallor.  Psychiatric: She has a normal mood and affect. Her behavior is normal. Thought content normal.  Vitals reviewed.    Assessment & Plan:   1. Acute right-sided thoracic back pain - CBC with Differential/Platelet - Comprehensive metabolic panel - tamsulosin (FLOMAX) 0.4 MG CAPS capsule; Take 1 capsule (0.4 mg total) by mouth daily.  Dispense: 14 capsule; Refill: 0 - US Renal; Future - indomethacin (INDOCIN) 50 MG capsule; Take 1 capsule (50 mg total) by mouth 2 (two) times daily with a meal.  Dispense: 10 capsule; Refill: 0 - Urinalysis Dipstick   Meds ordered this encounter  Medications  . tamsulosin (FLOMAX) 0.4 MG CAPS capsule    Sig: Take 1 capsule (0.4 mg total) by mouth daily.    Dispense:  14 capsule    Refill:  0    Order Specific Question:   Supervising Provider    Answer:   Quentin Angst [id number]  . indomethacin (INDOCIN) 50 MG capsule    Sig: Take 1 capsule (50 mg total) by mouth 2 (two) times daily with a meal.    Dispense:  10 capsule    Refill:  0    Order Specific Question:  Supervising Provider    Answer:   Tresa Garter [[id number]]    Follow-up: Return if symptoms worsen or fail to improve.   Clent Demark PA

## 2016-10-03 NOTE — Progress Notes (Signed)
New patient presents for back pain lasting more than one year  Pt rates the pain as a 5 today

## 2016-10-03 NOTE — Patient Instructions (Signed)
Clico renal (Renal Colic) El clico renal es un dolor causado por el paso de un clculo en el rin. El dolor puede ser agudo e intenso. Puede sentirse en la espalda, el abdomen, al costado (fosa lumbar) o la ingle. Puede causar nuseas. El clico renal puede aparecer y Geneticist, molecular. INSTRUCCIONES PARA EL CUIDADO EN EL HOGAR Controle su afeccin para ver si hay cambios. Las siguientes medidas pueden servir para Paramedic cualquier molestia que est sintiendo:  Tome los medicamentos solamente como se lo haya indicado el mdico.  Pregntele al mdico si puede tomar analgsicos de [place].  Beba suficiente lquido para Photographer orina clara o de color amarillo plido. Albesa Seen entre 6 y 8vasos de agua por Futures trader.  Limite la cantidad de sal que consume a menos de 2gramos por da.  Reduzca la cantidad de protenas de la dieta. Consuma menos carne, pescado, frutos secos y productos lcteos.  Evite alimentos como la espinaca, el ruibarbo, los frutos secos o el salvado, ya que pueden aumentar la probabilidad de que se formen clculos. SOLICITE ATENCIN MDICA SI:  Tiene fiebre o siente escalofros.  La orina se torna de color turbio o tiene [employer].  Siente dolor o ardor al Geographical information systems officer. SOLICITE ATENCIN MDICA DE INMEDIATO SI:  El dolor en la fosa lumbar o la ingle se intensifica repentinamente.  Est confundido o desorientado, o pierde la conciencia. Esta informacin no tiene Theme park manager el consejo del mdico. Asegrese de hacerle al mdico cualquier pregunta que tenga. Document Released: 03/20/2005 Document Revised: 07/01/2014 Document Reviewed: 04/20/2014 Elsevier Interactive Patient Education  2017 [employer].

## 2016-10-04 LAB — COMPREHENSIVE METABOLIC PANEL
A/G RATIO: 1.3 (ref 1.2–2.2)
ALT: 22 IU/L (ref 0–32)
AST: 18 IU/L (ref 0–40)
Albumin: 4.5 g/dL (ref 3.5–5.5)
Alkaline Phosphatase: 48 IU/L (ref 39–117)
BUN/Creatinine Ratio: 22 (ref 9–23)
BUN: 11 mg/dL (ref 6–20)
Bilirubin Total: 0.2 mg/dL (ref 0.0–1.2)
CALCIUM: 9.4 mg/dL (ref 8.7–10.2)
CO2: 26 mmol/L (ref 18–29)
Chloride: 102 mmol/L (ref 96–106)
Creatinine, Ser: 0.51 mg/dL — ABNORMAL LOW (ref 0.57–1.00)
GFR calc Af Amer: 145 mL/min/{1.73_m2} (ref >59)
GFR calc non Af Amer: 126 mL/min/{1.73_m2} (ref >59)
GLOBULIN, TOTAL: 3.4 g/dL (ref 1.5–4.5)
Glucose: 86 mg/dL (ref 65–99)
POTASSIUM: 4.5 mmol/L (ref 3.5–5.2)
SODIUM: 140 mmol/L (ref 134–144)
Total Protein: 7.9 g/dL (ref 6.0–8.5)

## 2016-10-04 LAB — CBC WITH DIFFERENTIAL/PLATELET
BASOS: 1 %
Basophils Absolute: 0 10*3/uL (ref 0.0–0.2)
EOS (ABSOLUTE): 0.1 10*3/uL (ref 0.0–0.4)
Eos: 3 %
HEMATOCRIT: 38.7 % (ref 34.0–46.6)
HEMOGLOBIN: 12.7 g/dL (ref 11.1–15.9)
IMMATURE GRANULOCYTES: 0 %
Immature Grans (Abs): 0 10*3/uL (ref 0.0–0.1)
Lymphocytes Absolute: 2.4 10*3/uL (ref 0.7–3.1)
Lymphs: 46 %
MCH: 28.5 pg (ref 26.6–33.0)
MCHC: 32.8 g/dL (ref 31.5–35.7)
MCV: 87 fL (ref 79–97)
MONOCYTES: 7 %
Monocytes Absolute: 0.4 10*3/uL (ref 0.1–0.9)
NEUTROS PCT: 43 %
Neutrophils Absolute: 2.3 10*3/uL (ref 1.4–7.0)
Platelets: 280 10*3/uL (ref 150–379)
RBC: 4.46 x10E6/uL (ref 3.77–5.28)
RDW: 13.1 % (ref 12.3–15.4)
WBC: 5.2 10*3/uL (ref 3.4–10.8)

## 2016-10-11 ENCOUNTER — Ambulatory Visit (HOSPITAL_COMMUNITY): Payer: Self-pay

## 2016-10-16 ENCOUNTER — Ambulatory Visit (HOSPITAL_COMMUNITY)
Admission: RE | Admit: 2016-10-16 | Discharge: 2016-10-16 | Disposition: A | Discharge status: Home / Self Care | Payer: Self-pay | Source: Ambulatory Visit | Attending: Physician Assistant | Admitting: Physician Assistant

## 2016-10-16 DIAGNOSIS — M546 Pain in thoracic spine: Secondary | ICD-10-CM | POA: Insufficient documentation

## 2016-10-17 ENCOUNTER — Telehealth (INDEPENDENT_AMBULATORY_CARE_PROVIDER_SITE_OTHER): Payer: Self-pay | Admitting: Physician Assistant

## 2016-10-17 NOTE — Telephone Encounter (Signed)
Patient called would like a [place] results please call back at ph:[phone].

## 2016-10-17 NOTE — Telephone Encounter (Signed)
Results given to patient. Maryjean Morn, CMA

## 2016-10-25 ENCOUNTER — Ambulatory Visit (INDEPENDENT_AMBULATORY_CARE_PROVIDER_SITE_OTHER): Payer: Self-pay

## 2017-01-17 ENCOUNTER — Ambulatory Visit (INDEPENDENT_AMBULATORY_CARE_PROVIDER_SITE_OTHER): Payer: Self-pay

## 2017-01-24 ENCOUNTER — Ambulatory Visit: Payer: Self-pay | Attending: Physician Assistant

## 2017-01-31 ENCOUNTER — Ambulatory Visit (INDEPENDENT_AMBULATORY_CARE_PROVIDER_SITE_OTHER): Payer: Self-pay

## 2017-02-18 ENCOUNTER — Ambulatory Visit (INDEPENDENT_AMBULATORY_CARE_PROVIDER_SITE_OTHER): Payer: Self-pay | Admitting: Physician Assistant

## 2017-02-18 ENCOUNTER — Encounter (INDEPENDENT_AMBULATORY_CARE_PROVIDER_SITE_OTHER): Payer: Self-pay | Admitting: Physician Assistant

## 2017-02-18 VITALS — BP 114/76 | HR 82 | Temp 98.1°F | Wt 142.0 lb

## 2017-02-18 DIAGNOSIS — M79672 Pain in left foot: Secondary | ICD-10-CM

## 2017-02-18 DIAGNOSIS — M79671 Pain in right foot: Secondary | ICD-10-CM

## 2017-02-18 DIAGNOSIS — M546 Pain in thoracic spine: Secondary | ICD-10-CM

## 2017-02-18 MED ORDER — NAPROXEN 500 MG PO TABS: 500.0000 mg | ORAL_TABLET | Freq: Two times a day (BID) | ORAL | 0 refills | Status: DC

## 2017-02-18 MED ORDER — ACETAMINOPHEN-CODEINE #3 300-30 MG PO TABS: 1.0000 | ORAL_TABLET | Freq: Three times a day (TID) | ORAL | 0 refills | Status: AC | PRN

## 2017-02-18 NOTE — Progress Notes (Signed)
Subjective:  Patient ID: Destiny Cooper, female    DOB: March 28, 1981  Age: 36 y.o. MRN: [id number]  CC: f/u heel and back pain  HPI   Kalaysia Barbuto is a 36 y.o. female with a PMH of renal stones presents to f/u on right sided mid back pain. She was prescribed Indomethacin but she did not take it because she did not have the need to. Feels pain only when she has to flex and lift something heavy. Pt takes ibuprofen when pain is severe. Ibuprofen helps resolve the pain.      Bilateral heel pain since "years" ago. Has been progressing over the years. Worse with prolonged standing and walking. Uses tennis shoes which help somewhat with the pain.    Outpatient Medications Prior to Visit  Medication Sig Dispense Refill  . ibuprofen (ADVIL,MOTRIN) 600 MG tablet Take 1 tablet (600 mg total) by mouth every 6 (six) hours. (Patient not taking: Reported on 10/03/2016) 30 tablet 0  . indomethacin (INDOCIN) 50 MG capsule Take 1 capsule (50 mg total) by mouth 2 (two) times daily with a meal. (Patient not taking: Reported on 02/18/2017) 10 capsule 0  . tamsulosin (FLOMAX) 0.4 MG CAPS capsule Take 1 capsule (0.4 mg total) by mouth daily. (Patient not taking: Reported on 02/18/2017) 14 capsule 0   No facility-administered medications prior to visit.      ROS Review of Systems  Constitutional: Negative for chills, fever and malaise/fatigue.  Eyes: Negative for blurred vision.  Respiratory: Negative for shortness of breath.   Cardiovascular: Negative for chest pain and palpitations.  Gastrointestinal: Negative for abdominal pain and nausea.  Genitourinary: Negative for dysuria and hematuria.  Musculoskeletal: Positive for back pain. Negative for joint pain and myalgias.       Foot  Skin: Negative for rash.  Neurological: Negative for tingling and headaches.  Psychiatric/Behavioral: Negative for depression. The patient is not nervous/anxious.     Objective:  BP 114/76 (BP Location: Right Arm,  Patient Position: Sitting, Cuff Size: Normal)   Pulse 82   Temp 98.1 F (36.7 C) (Oral)   Wt 142 lb (64.4 kg)   LMP 01/19/2017 (Exact Date)   SpO2 98%   BMI 26.40 kg/m   BP/Weight 02/18/2017 10/03/2016 06/15/2014  Systolic BP 114 119 99  Diastolic BP 76 77 60  Wt. (Lbs) 142 145 -  BMI 26.4 26.95 -      Physical Exam  Constitutional: She is oriented to person, place, and time.  Well developed, well nourished, NAD, polite  HENT:  Head: Normocephalic and atraumatic.  Eyes: No scleral icterus.  Cardiovascular: Normal rate, regular rhythm and normal heart sounds.   Pulmonary/Chest: Effort normal and breath sounds normal.  Musculoskeletal: She exhibits no edema.  Pain with palpation of the center of the calcaneus bilaterally. No edema, erythema, deformity, or ecchymosis.  Back is without TTP or limited aROM.  Neurological: She is alert and oriented to person, place, and time. No cranial nerve deficit. Coordination normal.  Skin: Skin is warm and dry. No rash noted. No erythema. No pallor.  Psychiatric: She has a normal mood and affect. Her behavior is normal. Thought content normal.  Vitals reviewed.    Assessment & Plan:    1. Acute right-sided thoracic back pain - DG Thoracic Spine 2 View; Future - Begin naproxen (NAPROSYN) 500 MG tablet; Take 1 tablet (500 mg total) by mouth 2 (two) times daily with a meal.  Dispense: 30 tablet; Refill: 0  2. Bilateral foot  pain - DG Foot Complete Right; Future - DG Foot Complete Left; Future - Begin naproxen (NAPROSYN) 500 MG tablet; Take 1 tablet (500 mg total) by mouth 2 (two) times daily with a meal.  Dispense: 30 tablet; Refill: 0   Meds ordered this encounter  Medications  . naproxen (NAPROSYN) 500 MG tablet    Sig: Take 1 tablet (500 mg total) by mouth 2 (two) times daily with a meal.    Dispense:  30 tablet    Refill:  0    Order Specific Question:   Supervising Provider    Answer:   Quentin Angst [[id number]]     Follow-up: Return in about 2 weeks (around 03/04/2017).   Loletta Specter PA

## 2017-02-18 NOTE — Patient Instructions (Signed)

## 2017-02-26 ENCOUNTER — Ambulatory Visit (HOSPITAL_COMMUNITY)
Admission: RE | Admit: 2017-02-26 | Discharge: 2017-02-26 | Disposition: A | Discharge status: Home / Self Care | Payer: Self-pay | Source: Ambulatory Visit | Attending: Physician Assistant | Admitting: Physician Assistant

## 2017-02-26 DIAGNOSIS — M79672 Pain in left foot: Secondary | ICD-10-CM | POA: Insufficient documentation

## 2017-02-26 DIAGNOSIS — M546 Pain in thoracic spine: Secondary | ICD-10-CM | POA: Insufficient documentation

## 2017-02-26 DIAGNOSIS — M79671 Pain in right foot: Secondary | ICD-10-CM | POA: Insufficient documentation

## 2017-03-05 ENCOUNTER — Encounter (INDEPENDENT_AMBULATORY_CARE_PROVIDER_SITE_OTHER): Payer: Self-pay | Admitting: Physician Assistant

## 2017-03-05 ENCOUNTER — Ambulatory Visit (INDEPENDENT_AMBULATORY_CARE_PROVIDER_SITE_OTHER): Payer: Self-pay | Admitting: Physician Assistant

## 2017-03-05 VITALS — BP 107/71 | HR 72 | Temp 97.9°F | Wt 142.0 lb

## 2017-03-05 DIAGNOSIS — Z91199 Patient's noncompliance with other medical treatment and regimen due to unspecified reason: Secondary | ICD-10-CM

## 2017-03-05 DIAGNOSIS — M722 Plantar fascial fibromatosis: Secondary | ICD-10-CM

## 2017-03-05 DIAGNOSIS — Z9119 Patient's noncompliance with other medical treatment and regimen: Secondary | ICD-10-CM

## 2017-03-05 MED ORDER — NAPROXEN 500 MG PO TABS: 500.0000 mg | ORAL_TABLET | Freq: Two times a day (BID) | ORAL | 0 refills | Status: DC

## 2017-03-05 NOTE — Patient Instructions (Addendum)
Compre lo siguiente: Plantar Fasciitis foot sleep support Plantar Fasciitis insoles  Fascitis plantar (Plantar Fasciitis) La fascitis plantar es una afeccin dolorosa que se produce en el taln. Ocurre cuando la banda de tejido que [employer] dedos con el hueso del taln (fascia plantar) se irrita. Esto puede ocurrir despus de Academic librarian ejercicio u otras actividades repetitivas (lesin por uso excesivo). El dolor de la fascitis plantar puede ser de leve (irritacin) a intenso, y en los casos ms agudos puede dificultar que la persona camine o se [place]. Por lo general, el dolor es peor a la maana o despus de Personal assistant sentado o acostado durante un perodo. CAUSAS Este trastorno puede ser causado por:  Estar de pie durante largos perodos.  Usar zapatos que no calcen bien.  Practicar actividades de alto impacto, como correr, o hacer ejercicios [employer] o ballet.  Tener sobrepeso.  Tener una forma de caminar (marcha) anormal.  Tener los msculos de la pantorrilla tensos.  Tener el arco alto en los pies.  Comenzar una nueva actividad fsica. SNTOMAS El sntoma principal de esta afeccin es el dolor en el taln. Otros sntomas pueden ser los siguientes:  Dolor que empeora despus de una actividad o un ejercicio.  Dolor ms intenso a la maana o despus de Lawyer.  Dolor que desaparece despus de caminar durante unos minutos. DIAGNSTICO Esta afeccin se puede diagnosticar en funcin de los signos y los sntomas. El mdico Location manager un examen fsico para controlar si tiene lo siguiente:  Un rea dolorida en la parte inferior del pie.  El arco alto.  Dolor al Doctor, general practice.  Dificultad para mover el pie. Tambin puede necesitar estudios por imgenes para confirmar el diagnstico. Estos pueden incluir los siguientes:  Radiografas.  Ecografa.  Resonancia magntica. TRATAMIENTO El tratamiento de la fascitis plantar depende de la gravedad de la  afeccin. El tratamiento puede incluir lo siguiente:  Reposo, hielo y analgsicos de venta libre para Human resources officer.  Ejercicios para estirar las pantorrillas y la fascia plantar.  Una frula que [employer] estirado y [place] mientras usted duerme (frula nocturna).  Fisioterapia para [employer] sntomas y Physiological scientist en el futuro.  Inyecciones de cortisona para Engineer, materials intenso.  Tratamiento con ondas de choque extracorpreas para estimular con impulsos elctricos la fascia plantar lesionada. Esto suele usarse como un ltimo recurso antes de la [place].  [place], si los otros tratamientos no han funcionado despus de . INSTRUCCIONES PARA EL CUIDADO EN EL HOGAR  Tome los medicamentos solamente como se lo haya indicado el mdico.  Evite las actividades que causan dolor.  Frote la parte inferior del pie sobre una bolsa de hielo o una botella de agua fra. Haga esto durante , de 3a 4veces al [employer].  Realice estiramientos simples como se lo haya indicado el mdico.  Trate de usar calzado deportivo con amortiguacin de aire o gel, o plantillas blandas.  Si el mdico se lo ha indicado, use una frula nocturna para dormir.  Cumpla con todas las visitas de control, segn le indique su mdico. PREVENCIN  No realice ejercicios ni actividades que le causen dolor en el taln.  Considere la posibilidad de Corporate investment banker actividades de bajo impacto si sigue teniendo problemas.  Pierda peso si lo necesita. La mejor forma de prevenir la fascitis plantar es evitar las actividades que lesionan ms la fascia plantar. SOLICITE ATENCIN MDICA SI:  Los sntomas no desaparecen despus del tratamiento en su casa.  El dolor Simmsempeora.  El Artistdolor afecta la capacidad de moverse o de Education officer, environmentalrealizar las actividades diarias. Esta informacin no tiene Theme park managercomo fin reemplazar el consejo del mdico. Asegrese de hacerle al mdico cualquier pregunta que tenga. Document  Released: 03/20/2005 Document Revised: 10/02/2015 Document Reviewed: 04/20/2014 Elsevier Interactive Patient Education  2018 [employer]

## 2017-03-05 NOTE — Progress Notes (Signed)
   Subjective:  Patient ID: Destiny Cooper, female    DOB: 01/22/1981  Age: 36 y.o. MRN: [id number]  CC: f/u plantar fasciitis  HPI  Destiny FritzBlanca Rodriguezis a 36 y.o.femalewith a PMH of renal stones presents to f/u on bilateral foot pain. She had XR done on both feet which were normal. Has not been taking Naproxen 500 mg BID because she does not want to depend on medications for relief. Continues with pain in both feet same as in previous visit.     Outpatient Medications Prior to Visit  Medication Sig Dispense Refill  . indomethacin (INDOCIN) 50 MG capsule Take 1 capsule (50 mg total) by mouth 2 (two) times daily with a meal. (Patient not taking: Reported on 02/18/2017) 10 capsule 0  . naproxen (NAPROSYN) 500 MG tablet Take 1 tablet (500 mg total) by mouth 2 (two) times daily with a meal. (Patient not taking: Reported on 03/05/2017) 30 tablet 0   No facility-administered medications prior to visit.      ROS Review of Systems  Constitutional: Negative for chills, fever and malaise/fatigue.  Eyes: Negative for blurred vision.  Respiratory: Negative for shortness of breath.   Cardiovascular: Negative for chest pain and palpitations.  Gastrointestinal: Negative for abdominal pain and nausea.  Genitourinary: Negative for dysuria and hematuria.  Musculoskeletal: Negative for joint pain and myalgias.       Bilateral foot pain.  Skin: Negative for rash.  Neurological: Negative for tingling and headaches.  Psychiatric/Behavioral: Negative for depression. The patient is not nervous/anxious.     Objective:  BP 107/71 (BP Location: Left Arm, Patient Position: Sitting, Cuff Size: Normal)   Pulse 72   Temp 97.9 F (36.6 C) (Oral)   Wt 142 lb (64.4 kg)   LMP 02/22/2017   SpO2 97%   BMI 26.40 kg/m   BP/Weight 03/05/2017 02/18/2017 10/03/2016  Systolic BP 107 114 119  Diastolic BP 71 76 77  Wt. (Lbs) 142 142 145  BMI 26.4 26.4 26.95      Physical Exam  Constitutional: She is  oriented to person, place, and time.  Well developed, well nourished, NAD, polite  HENT:  Head: Normocephalic and atraumatic.  Pulmonary/Chest: Effort normal.  Musculoskeletal: She exhibits no edema.  Neurological: She is alert and oriented to person, place, and time.  Slightly antalgic gait, especially when first standing from a seated position.  Skin: Skin is warm and dry. No rash noted. No erythema. No pallor.  Psychiatric: She has a normal mood and affect. Her behavior is normal. Thought content normal.  Vitals reviewed.    Assessment & Plan:    1. Plantar fasciitis - Advised to take Naproxen as directed. Naproxen (NAPROSYN) 500 MG tablet; Take 1 tablet (500 mg total) by mouth 2 (two) times daily with a meal.  Dispense: 30 tablet; Refill: 0 - Advised to use plantar fasciitis sleep support and plantar fasciitis insoles.  2. Noncompliance   Meds ordered this encounter  Medications  . naproxen (NAPROSYN) 500 MG tablet    Sig: Take 1 tablet (500 mg total) by mouth 2 (two) times daily with a meal.    Dispense:  30 tablet    Refill:  0    Order Specific Question:   Supervising Provider    Answer:   Quentin AngstJEGEDE, OLUGBEMIGA E [[id number]    Follow-up: Return in about 8 weeks (around 04/30/2017).   Loletta Specteroger David Merly Hinkson PA

## 2017-04-30 ENCOUNTER — Ambulatory Visit (INDEPENDENT_AMBULATORY_CARE_PROVIDER_SITE_OTHER): Payer: Self-pay | Admitting: Physician Assistant

## 2020-03-23 LAB — OB RESULTS CONSOLE ANTIBODY SCREEN: Antibody Screen: NEGATIVE

## 2020-03-23 LAB — OB RESULTS CONSOLE ABO/RH: RH Type: POSITIVE

## 2020-03-23 LAB — OB RESULTS CONSOLE GC/CHLAMYDIA
Chlamydia: NEGATIVE
Gonorrhea: NEGATIVE

## 2020-03-23 LAB — GLUCOSE, 1 HOUR
Drug Screen, Urine: NEGATIVE
Glucose 1 Hour: 150

## 2020-03-23 LAB — OB RESULTS CONSOLE HGB/HCT, BLOOD
HCT: 34 (ref 29–41)
Hemoglobin: 11.4

## 2020-03-23 LAB — OB RESULTS CONSOLE RPR: RPR: NONREACTIVE

## 2020-03-23 LAB — OB RESULTS CONSOLE PLATELET COUNT: Platelets: 254

## 2020-03-23 LAB — CYTOLOGY - PAP: Pap: NEGATIVE

## 2020-03-23 LAB — OB RESULTS CONSOLE RUBELLA ANTIBODY, IGM: Rubella: IMMUNE

## 2020-03-24 ENCOUNTER — Other Ambulatory Visit: Payer: Self-pay | Admitting: Family Medicine

## 2020-03-24 DIAGNOSIS — Z3682 Encounter for antenatal screening for nuchal translucency: Secondary | ICD-10-CM

## 2020-03-24 DIAGNOSIS — Z3A13 13 weeks gestation of pregnancy: Secondary | ICD-10-CM

## 2020-03-27 LAB — GLUCOSE, 3 HOUR GESTATIONAL
Glucose 1 Hour: 193
Glucose 2 Hour: 164
Glucose 3 Hour: 133
Glucose Tolerance, Fasting: 77 (ref 70–99)

## 2020-03-28 ENCOUNTER — Encounter: Payer: Self-pay | Admitting: *Deleted

## 2020-03-30 ENCOUNTER — Ambulatory Visit: Payer: Self-pay

## 2020-03-30 ENCOUNTER — Other Ambulatory Visit: Payer: Self-pay | Admitting: *Deleted

## 2020-03-30 ENCOUNTER — Ambulatory Visit: Payer: Self-pay | Admitting: Obstetrics and Gynecology

## 2020-03-30 ENCOUNTER — Ambulatory Visit (HOSPITAL_BASED_OUTPATIENT_CLINIC_OR_DEPARTMENT_OTHER): Payer: Self-pay

## 2020-03-30 ENCOUNTER — Other Ambulatory Visit: Payer: Self-pay

## 2020-03-30 ENCOUNTER — Ambulatory Visit: Payer: Self-pay | Attending: Obstetrics and Gynecology

## 2020-03-30 ENCOUNTER — Ambulatory Visit: Payer: Self-pay | Admitting: *Deleted

## 2020-03-30 VITALS — BP 107/56 | HR 81

## 2020-03-30 DIAGNOSIS — O09521 Supervision of elderly multigravida, first trimester: Secondary | ICD-10-CM

## 2020-03-30 DIAGNOSIS — O09529 Supervision of elderly multigravida, unspecified trimester: Secondary | ICD-10-CM

## 2020-03-30 DIAGNOSIS — Z3682 Encounter for antenatal screening for nuchal translucency: Secondary | ICD-10-CM

## 2020-03-30 DIAGNOSIS — Z3A13 13 weeks gestation of pregnancy: Secondary | ICD-10-CM

## 2020-03-30 NOTE — Progress Notes (Signed)
Referring Provider:  Loletta Specter Length of Consultation: 40 minutes  Ms. Destiny Cooper was referred to [facility] for genetic counseling because of advanced maternal age.  The patient will be 39 years old at the time of delivery.  This note summarizes the information we discussed.    We explained that the chance of a chromosome abnormality increases with maternal age.  Chromosomes and examples of chromosome problems were reviewed.  Humans typically have 46 chromosomes in each cell, with half passed through each sperm and egg.  Any change in the number or structure of chromosomes can increase the risk of problems in the physical and mental development of a pregnancy.   Based upon age of the patient, the chance of any chromosome abnormality was 1 in 41. The chance of Down syndrome, the most common chromosome problem associated with maternal age, was 1 in 4.  The risk of chromosome problems is in addition to the 3% general population risk for birth defects and intellectual disabilities.  The greatest chance, of course, is that the baby would be born in good health.  We discussed the following prenatal screening and testing options for this pregnancy:  Cell free fetal DNA testing analyzes maternal blood to determine whether or not the baby may have Down syndrome, trisomy 60, or trisomy 3.  This test utilizes a maternal blood sample and DNA sequencing technology to isolate circulating cell free fetal DNA from maternal plasma.  The fetal DNA can then be analyzed for DNA sequences that are derived from the three most common chromosomes involved in aneuploidy, chromosomes 13, 18, and 21.  If the overall amount of DNA is greater than the expected level for any of these chromosomes, aneuploidy is suspected.  While we do not consider it a replacement for invasive testing and karyotype analysis, a negative result from this testing would be reassuring, though not a guarantee of a normal chromosome  complement for the baby.  An abnormal result is certainly suggestive of an abnormal chromosome complement, though we would still recommend CVS or amniocentesis to confirm any findings from this testing.  First trimester screening, is another blood test which includes nuchal translucency ultrasound screen and first trimester maternal serum marker screening.  The nuchal translucency has approximately an 80% detection rate for Down syndrome and can be positive for other chromosome abnormalities as well as heart defects.  When combined with a maternal serum marker screening, the detection rate is up to 90% for Down syndrome and up to 97% for trisomy 18.     The chorionic villus sampling procedure is available for first trimester chromosome analysis.  This involves the withdrawal of a small amount of chorionic villi (tissue from the developing placenta).  Risk of pregnancy loss is estimated to be approximately 1 in 200 to 1 in 100 (0.5 to 1%).  There is approximately a 1% (1 in 100) chance that the CVS chromosome results will be unclear.  Chorionic villi cannot be tested for neural tube defects.     Maternal serum marker screening, a blood test that measures pregnancy proteins, can provide risk assessments for Down syndrome, trisomy 18, and open neural tube defects (spina bifida, anencephaly). Because it does not directly examine the fetus, it cannot positively diagnose or rule out these problems. The detection rate is approximately 75% for Down syndrome, 70% for trisomy 18 and 80% of open neural tube defects. If prior chromosome screening has been performed, then AFP only is recommended to test  for open neural tube defects alone.  Targeted ultrasound uses high frequency sound waves to create an image of the developing fetus.  An ultrasound is often recommended as a routine means of evaluating the pregnancy.  It is also used to screen for fetal anatomy problems (for example, a heart defect) that might be  suggestive of a chromosomal or other abnormality.   Amniocentesis involves the removal of a small amount of amniotic fluid from the sac surrounding the fetus with the use of a thin needle inserted through the maternal abdomen and uterus.  Ultrasound guidance is used throughout the procedure.  Fetal cells from amniotic fluid are directly evaluated and > 99.5% of chromosome problems and > 98% of open neural tube defects can be detected. This procedure is generally performed after the 15th week of pregnancy.  The main risks to this procedure include complications leading to miscarriage in less than 1 in 200 cases (0.5%).  Cystic Fibrosis and Spinal Muscular Atrophy (SMA) screening were also discussed with the patient. Both conditions are recessive, which means that both parents must be carriers in order to have a child with the disease.  Cystic fibrosis (CF) is one of the most common genetic conditions in persons of Caucasian ancestry.  This condition occurs in approximately 1 in 2,500 Caucasian persons and results in thickened secretions in the lungs, digestive, and reproductive systems.  For a baby to be at risk for having CF, both of the parents must be carriers for this condition.  Approximately 1 in 81 Caucasian persons is a carrier for CF.  Current carrier testing looks for the most common mutations in the gene for CF and can detect approximately 90% of carriers in the Caucasian population.  This means that the carrier screening can greatly reduce, but cannot eliminate, the chance for an individual to have a child with CF.  If an individual is found to be a carrier for CF, then carrier testing would be available for the partner. As part of [address] newborn screening profile, all babies born in the state of [place] will have a two-tier screening process.  Specimens are first tested to determine the concentration of immunoreactive trypsinogen (IRT).  The top 5% of specimens with the highest IRT  values then undergo DNA testing using a panel of over 40 common CF mutations. SMA is a neurodegenerative disorder that leads to atrophy of skeletal muscle and overall weakness.  This condition is also more prevalent in the Caucasian population, with 1 in 40-1 in 60 persons being a carrier and 1 in 6,000-1 in 10,000 children being affected.  There are multiple forms of the disease, with some causing death in infancy to other forms with survival into adulthood.  The genetics of SMA is complex, but carrier screening can detect up to 95% of carriers in the Caucasian population.  Similar to CF, a negative result can greatly reduce, but cannot eliminate, the chance to have a child with SMA.  Hemoglobinopathy screening was also offered to the patient.  We obtained a detailed family history and pregnancy history.  This is the fifth pregnancy for this couple.  They have 4 healthy daughters, ages 10, 82, 56 and 5 years.  The patient reported that her mother had one pregnancy loss with twins after age 52 years.  There were no known anomalies in those babies and the family was told it was due to age and stress.  The remainder of the family history is unremarkable for birth  defects, developmental delays, recurrent pregnancy loss or known chromosome abnormalities.  Ms. Beckstrom reported no complications in this pregnancy.  She reported no exposure to medications, alcohol, tobacco or recreational drugs in this pregnancy.  After consideration of the options, Ms. Dornbush elected to proceed with an ultrasound at [redacted] weeks gestation.  She declined carrier screening for CF, SMA and hemoglobinopathies as well as aneuploidy screening and diagnostic testing.  An ultrasound was performed at the time of the visit.  The gestational age was consistent with  13 weeks.  Fetal anatomy could not be assessed due to early gestational age.  Please refer to the ultrasound report for details of that study.  Ms. Moraes was encouraged to  call with questions or concerns.  We can be contacted at [phone].   Tests Ordered: none, declined   Cherly Anderson, MS, CGC

## 2020-04-04 ENCOUNTER — Ambulatory Visit: Payer: Self-pay | Admitting: Registered"

## 2020-04-04 ENCOUNTER — Other Ambulatory Visit: Payer: Self-pay

## 2020-04-04 ENCOUNTER — Encounter: Payer: Self-pay | Attending: Obstetrics & Gynecology | Admitting: Registered"

## 2020-04-04 DIAGNOSIS — Z3A00 Weeks of gestation of pregnancy not specified: Secondary | ICD-10-CM | POA: Insufficient documentation

## 2020-04-04 DIAGNOSIS — O24419 Gestational diabetes mellitus in pregnancy, unspecified control: Secondary | ICD-10-CM | POA: Insufficient documentation

## 2020-04-04 DIAGNOSIS — O24919 Unspecified diabetes mellitus in pregnancy, unspecified trimester: Secondary | ICD-10-CM | POA: Insufficient documentation

## 2020-04-04 NOTE — Progress Notes (Signed)
In person interpreter services provided by Raquel from [facility]  Patient was seen on 04/04/20 for Gestational Diabetes self-management. EDD 09/29/20. Patient states no history of GDM. Diet history obtained. Patient eats variety of all food groups. Beverages include water, coffee, 2-3x/week ~8 oz orange juice.    The following learning objectives were met by the patient :   States the definition of Gestational Diabetes  States why dietary management is important in controlling blood glucose  Describes the effects of carbohydrates on blood glucose levels  Demonstrates ability to create a balanced meal plan  Demonstrates carbohydrate counting   States when to check blood glucose levels  Demonstrates proper blood glucose monitoring techniques  States the effect of stress and exercise on blood glucose levels  States the importance of limiting caffeine and abstaining from alcohol and smoking  Plan:  Aim for 3 Carbohydrate Choices per meal (45 grams) +/- 1 either way  Aim for 1-2 Carbohydrate Choices per snack Begin reading food labels for Total Carbohydrate of foods If OK with your MD, consider  increasing your activity level by walking, Arm Chair Exercises or other activity daily as tolerated Begin checking Blood Glucose before breakfast and 2 hours after first bite of breakfast, lunch and dinner as directed by MD  Bring Log Book/Sheet and meter to every medical appointment  Baby Scripts: Patient not appropriate for Baby Scripts due to language barrier. Take medication if directed by MD  Blood glucose monitor given: [employer] Lot # [id number] CBG: 72 mg/dL  Patient instructed to monitor glucose levels: FBS: 60 - 95 mg/dl 2 hour: <120 mg/dl  Patient received the following handouts:  Nutrition Diabetes and Pregnancy  Carbohydrate Counting List  Blood glucose Log Sheet  Patient will be seen for follow-up as needed.

## 2020-04-10 ENCOUNTER — Encounter: Payer: Self-pay | Admitting: *Deleted

## 2020-04-10 DIAGNOSIS — O24419 Gestational diabetes mellitus in pregnancy, unspecified control: Secondary | ICD-10-CM

## 2020-04-10 DIAGNOSIS — O0990 Supervision of high risk pregnancy, unspecified, unspecified trimester: Secondary | ICD-10-CM | POA: Insufficient documentation

## 2020-04-11 ENCOUNTER — Other Ambulatory Visit: Payer: Self-pay | Admitting: *Deleted

## 2020-04-11 DIAGNOSIS — O24419 Gestational diabetes mellitus in pregnancy, unspecified control: Secondary | ICD-10-CM

## 2020-04-12 ENCOUNTER — Encounter: Payer: Self-pay | Admitting: *Deleted

## 2020-04-12 DIAGNOSIS — O0990 Supervision of high risk pregnancy, unspecified, unspecified trimester: Secondary | ICD-10-CM

## 2020-04-14 ENCOUNTER — Other Ambulatory Visit: Payer: Self-pay

## 2020-04-17 ENCOUNTER — Encounter: Payer: Self-pay | Admitting: Obstetrics and Gynecology

## 2020-04-20 ENCOUNTER — Other Ambulatory Visit: Payer: Self-pay

## 2020-04-20 ENCOUNTER — Encounter: Payer: Self-pay | Attending: Obstetrics & Gynecology | Admitting: Registered"

## 2020-04-20 ENCOUNTER — Ambulatory Visit: Payer: Self-pay | Admitting: Registered"

## 2020-04-20 DIAGNOSIS — Z3A00 Weeks of gestation of pregnancy not specified: Secondary | ICD-10-CM | POA: Insufficient documentation

## 2020-04-20 DIAGNOSIS — O24419 Gestational diabetes mellitus in pregnancy, unspecified control: Secondary | ICD-10-CM | POA: Insufficient documentation

## 2020-04-20 NOTE — Progress Notes (Signed)
Video Interpreter services provided by Mariana Kaufman [phone] from [employer] interpreter services  Patient was seen on 04/20/20 for follow-up assessment and education for Gestational Diabetes. EDD 09/29/20, [redacted]w[redacted]d. Patient states changes to diet/lifestyle including eating smaller portions of carbohydrates.  Patient is testing blood glucose as directed pre breakfast and 2 hours after each meal. Review of Log sheet shows CBGs WNL  The following learning objectives reviewed during follow-up visit:   Importance of including vegetables, fruit and whole grains in the diet  Long-term lifestyle habits that can help prevent developing Type 2 diabetes, including proper use of plastics and non-stick pans  Plan:   Continue with changes made to diet  Return for new ob visit and bring blood sugar log with you   Patient instructed to monitor glucose levels: FBS: 60 - 95 mg/dl 2 hour: <976 mg/dl  Patient received the following handouts:  none  Patient will be seen for follow-up as needed.

## 2020-05-01 ENCOUNTER — Other Ambulatory Visit: Payer: Self-pay

## 2020-05-01 ENCOUNTER — Encounter: Payer: Self-pay | Admitting: Obstetrics & Gynecology

## 2020-05-01 ENCOUNTER — Ambulatory Visit (INDEPENDENT_AMBULATORY_CARE_PROVIDER_SITE_OTHER): Payer: Self-pay | Admitting: Obstetrics & Gynecology

## 2020-05-01 DIAGNOSIS — O0990 Supervision of high risk pregnancy, unspecified, unspecified trimester: Secondary | ICD-10-CM

## 2020-05-01 DIAGNOSIS — O24919 Unspecified diabetes mellitus in pregnancy, unspecified trimester: Secondary | ICD-10-CM

## 2020-05-01 MED ORDER — ASPIRIN EC 81 MG PO TBEC: 81.0000 mg | DELAYED_RELEASE_TABLET | Freq: Every day | ORAL | 11 refills | Status: DC

## 2020-05-01 NOTE — Progress Notes (Signed)
°  Subjective:transfer for failed 3 hr GTT at HD    Destiny Cooper is a [id number] [redacted]w[redacted]d being seen today for her first obstetrical visit.  Her obstetrical history is significant for advanced maternal age and diabetes. Patient does intend to breast feed. Pregnancy history fully reviewed.  Patient reports no complaints.  Vitals:   05/01/20 1348  BP: 94/61  Pulse: 87  Weight: 147 lb 6.4 oz (66.9 kg)    HISTORY: OB History  Gravida Para Term Preterm AB Living  5 4 4     4   SAB TAB Ectopic Multiple Live Births        0 4    # Outcome Date GA Lbr Len/2nd Weight Sex Delivery Anes PTL Lv  5 Current           4 Term 06/13/14 [redacted]w[redacted]d  8 lb 9.4 oz (3.895 kg) F Vag-Spont None  LIV  3 Term 04/14/12 [redacted]w[redacted]d 02:02 / 00:06 8 lb 10.5 oz (3.925 kg) F Vag-Spont None  LIV     Birth Comments: none  2 Term 05/10/06 [redacted]w[redacted]d  7 lb (3.175 kg)  Vag-Spont None  LIV     Birth Comments: wnl  1 Term 07/30/01 [redacted]w[redacted]d  7 lb (3.175 kg)  Vag-Spont   LIV     Birth Comments: wnl   Past Medical History:  Diagnosis Date   Abnormal Pap smear    Active labor at term 04/14/2012   History of UTI    Murmur    Postpartum care following vaginal delivery 06/15/2014   Vaginal delivery 04/14/2012   Vaginal Pap smear, abnormal    Past Surgical History:  Procedure Laterality Date   NO PAST SURGERIES     History reviewed. No pertinent family history.   Exam    Uterus:     Pelvic Exam:                                    Skin: normal coloration and turgor, no rashes    Neurologic: oriented, normal mood   Extremities: normal strength, tone, and muscle mass   HEENT PERRLA and trachea midline   Mouth/Teeth dental hygiene good   Neck supple   Cardiovascular: regular rate and rhythm   Respiratory:  appears well, vitals normal, no respiratory distress, acyanotic, normal RR, neck free of mass or lymphadenopathy   Abdomen: gravid 18 weeks   Urinary:        Assessment:    Pregnancy: 04/16/2012 Patient  Active Problem List   Diagnosis Date Noted   Supervision of high risk pregnancy, antepartum 04/10/2020   Diabetes mellitus complicating pregnancy, antepartum 04/04/2020        Plan:     Initial labs drawn. Prenatal vitamins. Problem list reviewed and updated. Genetic Screening discussed First Screen and Integrated Screen: results reviewed.  Ultrasound discussed; fetal survey: ordered.  Follow up in 4 weeks. 50% of 30 min visit spent on counseling and coordination of care.  ASA daily, fetal echo ordered   06/04/2020 05/01/2020

## 2020-05-01 NOTE — Patient Instructions (Signed)
Diabetes mellitus tipo1 o tipo2 durante el embarazo, diagnstico Type 1 or Type 2 Diabetes Mellitus During Pregnancy, Diagnosis La diabetes tipo1 (diabetes mellitus tipo1) y la diabetes tipo2 (diabetes mellitus tipo2) son enfermedades a Air cabin crew (crnicas). Su diabetes puede deberse a uno de [employer] o a ambos:  El pncreas no produce suficiente cantidad de una hormona llamada insulina.  El pncreas no responde de forma normal a la insulina que produce. La insulina permite que los ciertos azcares (glucosa) ingresen a las clulas del cuerpo. Esto le proporciona energa. Si tiene diabetes, los azcares no pueden ingresar a las clulas. Esto produce un aumento del nivel de Banker (hiperglucemia). Si la diabetes se trata, es posible que ni usted ni el beb se vean afectados. El mdico fijar los objetivos del tratamiento para usted. En general, los resultados de los niveles de azcar en la sangre deben ser los siguientes:  Despus de no comer durante mucho tiempo (ayunar): 95mg /dl ( ).  Despus de las comidas (posprandial): ? Una hora despus de una comida: igual o menor que 140mg /dl (1,6XWRU/E). ? Dos horas despus de una comida: igual o menor que 120mg /dl ( ).  Nivel de A1c (hemoglobinaA1c): del 6% al 6,5%. Siga estas indicaciones en su casa: Preguntas para hacerle al mdico  Puede hacer las siguientes preguntas: ? Es necesario que consulte a 4,5WUJW/J en el cuidado de la diabetes? ? Dnde puedo encontrar un grupo de apoyo para las personas con diabetes? ? Qu equipos necesitar para cuidarme en casa? ? Qu medicamentos necesito? Cundo debo tomarlos? ? Con qu frecuencia debo controlar mi nivel de azcar en la sangre? ? A qu nmero puedo llamar si tengo preguntas? ? Cundo es mi prxima cita con el mdico? Instrucciones generales  de venta libre y los recetados solamente como se lo haya indicado el  mdico.  Mantenga un peso saludable durante el [place].  Concurra a todas las visitas de IT trainer se lo haya indicado el mdico. Esto es importante. Comunquese con un mdico si:  Su nivel de azcar en la sangre es igual o mayor que 240mg /dl ([employer]).  Su nivel de azcar en la sangre es igual o mayor que 200mg /dl ([place]), y tiene cetonas en la orina.  Ha estado enferma o ha tenido fiebre durante ms de [address] y no [place].  Tiene alguno de estos problemas durante ms de 6horas: ? No puede comer ni beber. ? Siente malestar estomacal (nuseas). ? Vomita. ? Presenta heces lquidas (diarrea). Solicite ayuda de inmediato si:  El nivel de azcar en la sangre est por debajo de 54mg /dl (43mmol/l).  Se siente confundida.  Tiene dificultad para hacer lo siguiente: ? Pensar con claridad. ? Respirar.  El beb se mueve menos de lo normal.  Tiene los siguientes sntomas: ? Niveles moderados o altos de cetonas en la orina. ? Sangrado vaginal. ? Secrecin de un lquido fuera de lo comn de la vagina. ? Contracciones prematuras. Estas pueden causar una sensacin de opresin en el vientre. Resumen  La diabetes tipo1 (diabetes mellitus tipo1) y la diabetes tipo2 (diabetes mellitus tipo2) son enfermedades a (crnicas).  Si la diabetes se trata, es posible que ni usted ni el beb se vean afectados.  El mdico fijar los objetivos del tratamiento para usted. Esta informacin no tiene 57,8IONG/E el consejo del mdico. Asegrese de hacerle al mdico cualquier pregunta que tenga. Document Revised: 04/11/2017 Document Reviewed: 07/14/2015 Elsevier Patient Education  2020 .

## 2020-05-02 ENCOUNTER — Telehealth: Payer: Self-pay

## 2020-05-02 NOTE — Telephone Encounter (Signed)
Front office notified to sent appropriate records for [facility] referral for fetal echo. Records sent. Pt will be contacted by [facility] new patient coordinator.   Called [facility] Pt scheduled for diabetic eye exam for 07/26/19. Pt called with [facility] interpreter Mikle Bosworth ID [phone]; VM left stating pt has been scheduled for a new appt and may call the office for more information or follow up at next visit. Callback number given.

## 2020-05-08 ENCOUNTER — Other Ambulatory Visit: Payer: Self-pay

## 2020-05-11 ENCOUNTER — Other Ambulatory Visit: Payer: Self-pay | Admitting: *Deleted

## 2020-05-11 ENCOUNTER — Ambulatory Visit: Payer: Self-pay | Admitting: *Deleted

## 2020-05-11 ENCOUNTER — Ambulatory Visit: Payer: Self-pay | Attending: Obstetrics and Gynecology

## 2020-05-11 ENCOUNTER — Other Ambulatory Visit: Payer: Self-pay

## 2020-05-11 DIAGNOSIS — Z3A19 19 weeks gestation of pregnancy: Secondary | ICD-10-CM

## 2020-05-11 DIAGNOSIS — O43192 Other malformation of placenta, second trimester: Secondary | ICD-10-CM

## 2020-05-11 DIAGNOSIS — O24410 Gestational diabetes mellitus in pregnancy, diet controlled: Secondary | ICD-10-CM

## 2020-05-11 DIAGNOSIS — O0990 Supervision of high risk pregnancy, unspecified, unspecified trimester: Secondary | ICD-10-CM

## 2020-05-11 DIAGNOSIS — Z3682 Encounter for antenatal screening for nuchal translucency: Secondary | ICD-10-CM

## 2020-05-11 DIAGNOSIS — O09522 Supervision of elderly multigravida, second trimester: Secondary | ICD-10-CM

## 2020-05-11 DIAGNOSIS — Z363 Encounter for antenatal screening for malformations: Secondary | ICD-10-CM

## 2020-05-11 DIAGNOSIS — O09529 Supervision of elderly multigravida, unspecified trimester: Secondary | ICD-10-CM | POA: Insufficient documentation

## 2020-05-17 ENCOUNTER — Encounter (HOSPITAL_COMMUNITY): Payer: Self-pay

## 2020-05-17 ENCOUNTER — Other Ambulatory Visit: Payer: Self-pay

## 2020-05-17 ENCOUNTER — Emergency Department (HOSPITAL_COMMUNITY)
Admission: EM | Admit: 2020-05-17 | Discharge: 2020-05-18 | Disposition: A | Discharge status: Home / Self Care | Payer: Self-pay | Attending: Emergency Medicine | Admitting: Emergency Medicine

## 2020-05-17 ENCOUNTER — Emergency Department (HOSPITAL_COMMUNITY): Payer: Self-pay

## 2020-05-17 DIAGNOSIS — O9A219 Injury, poisoning and certain other consequences of external causes complicating pregnancy, unspecified trimester: Secondary | ICD-10-CM | POA: Insufficient documentation

## 2020-05-17 DIAGNOSIS — Y92813 Airplane as the place of occurrence of the external cause: Secondary | ICD-10-CM | POA: Insufficient documentation

## 2020-05-17 DIAGNOSIS — O24419 Gestational diabetes mellitus in pregnancy, unspecified control: Secondary | ICD-10-CM | POA: Insufficient documentation

## 2020-05-17 DIAGNOSIS — Z7982 Long term (current) use of aspirin: Secondary | ICD-10-CM | POA: Insufficient documentation

## 2020-05-17 DIAGNOSIS — Z3A00 Weeks of gestation of pregnancy not specified: Secondary | ICD-10-CM | POA: Insufficient documentation

## 2020-05-17 DIAGNOSIS — S93491A Sprain of other ligament of right ankle, initial encounter: Secondary | ICD-10-CM | POA: Insufficient documentation

## 2020-05-17 DIAGNOSIS — Y9301 Activity, walking, marching and hiking: Secondary | ICD-10-CM | POA: Insufficient documentation

## 2020-05-17 DIAGNOSIS — X501XXA Overexertion from prolonged static or awkward postures, initial encounter: Secondary | ICD-10-CM | POA: Insufficient documentation

## 2020-05-17 NOTE — ED Triage Notes (Signed)
Pt states that she slipped down 3 stairs and hurt her R ankle, did not hit head, no LOC.

## 2020-05-18 MED ORDER — ACETAMINOPHEN 500 MG PO TABS
1000.0000 mg | ORAL_TABLET | Freq: Once | ORAL | Status: AC
  Administered 2020-05-18: 1000 mg via ORAL
  Filled 2020-05-18: qty 2

## 2020-05-18 NOTE — Progress Notes (Signed)
Orthopedic Tech Progress Note Patient Details:  Destiny Cooper April 25, 1981 [id number]  Ortho Devices Type of Ortho Device: CAM walker Ortho Device/Splint Location: rle Ortho Device/Splint Interventions: Ordered, Adjustment, Application   Post Interventions Patient Tolerated: Well Instructions Provided: Care of device, Adjustment of device   Trinna Post 05/18/2020, 4:32 AM

## 2020-05-18 NOTE — ED Provider Notes (Signed)
[facility] Provider Note  CSN: [id number] Arrival date & time: 05/17/20 2158  Chief Complaint(s) Ankle Pain  HPI [facility] is a 39 y.o. female    Ankle Pain Location:  Ankle Pain details:    Quality:  Aching   Severity:  Moderate   Onset quality:  Sudden   Timing:  Constant Chronicity:  New Relieved by:  Immobilization Worsened by:  Bearing weight and flexion Associated symptoms: swelling   Associated symptoms: no back pain, no decreased ROM, no muscle weakness and no numbness    Patient reports twisting her ankle while walking down a flight of stairs.  Denied any head trauma or loss of consciousness.  Denies any other physical complaints including chest pain or abdominal pain.  Patient does report being a 5 months pregnant.  Past Medical History Past Medical History:  Diagnosis Date  . Abnormal Pap smear   . Active labor at term 04/14/2012  . History of UTI   . Murmur   . Postpartum care following vaginal delivery 06/15/2014  . Vaginal delivery 04/14/2012  . Vaginal Pap smear, abnormal    Patient Active Problem List   Diagnosis Date Noted  . Supervision of high risk pregnancy, antepartum 04/10/2020  . Diabetes mellitus complicating pregnancy, antepartum 04/04/2020   Home Medication(s) Prior to Admission medications   Medication Sig Start Date End Date Taking? Authorizing Provider  aspirin EC 81 MG tablet Take 1 tablet (81 mg total) by mouth daily. Swallow whole. 05/01/20   Adam Phenix, MD  Prenatal Vit w/Fe-Methylfol-FA (PNV PO) Take by mouth.    [provider]                                                                                                                                    Past Surgical History Past Surgical History:  Procedure Laterality Date  . NO PAST SURGERIES     Family History No family history on file.  Social History Social History   Tobacco Use  . Smoking status: Never  Smoker  . Smokeless tobacco: Never Used  Substance Use Topics  . Alcohol use: Not Currently  . Drug use: No   Allergies Patient has no known allergies.  Review of Systems Review of Systems  Musculoskeletal: Negative for back pain.   All other systems are reviewed and are negative for acute change except as noted in the HPI  Physical Exam Vital Signs  I have reviewed the triage vital signs BP (!) 106/59   Pulse 81   Temp 97.8 F (36.6 C) (Oral)   Resp 16   LMP 12/24/2019 (Approximate)   SpO2 100%   Physical Exam Vitals reviewed.  Constitutional:      General: She is not in acute distress.    Appearance: She is well-developed. She is not diaphoretic.  HENT:     Head: Normocephalic and atraumatic.  Right Ear: External ear normal.     Left Ear: External ear normal.     Nose: Nose normal.  Eyes:     General: No scleral icterus.    Conjunctiva/sclera: Conjunctivae normal.  Neck:     Trachea: Phonation normal.  Cardiovascular:     Rate and Rhythm: Normal rate and regular rhythm.  Pulmonary:     Effort: Pulmonary effort is normal. No respiratory distress.     Breath sounds: No stridor.  Chest:     Chest wall: No tenderness.  Abdominal:     General: There is no distension.     Tenderness: There is no abdominal tenderness.  Musculoskeletal:        General: Normal range of motion.     Cervical back: Normal range of motion.     Right lower leg: No tenderness.     Right ankle: Swelling present. Tenderness present over the lateral malleolus and ATF ligament. Normal pulse.  Neurological:     Mental Status: She is alert and oriented to person, place, and time.  Psychiatric:        Behavior: Behavior normal.     ED Results and Treatments Labs (all labs ordered are listed, but only abnormal results are displayed) Labs Reviewed - No data to display                                                                                                                         EKG  EKG Interpretation  Date/Time:    Ventricular Rate:    PR Interval:    QRS Duration:   QT Interval:    QTC Calculation:   R Axis:     Text Interpretation:        Radiology DG Ankle Complete Right  Result Date: 05/17/2020 CLINICAL DATA:  Status post fall. EXAM: RIGHT ANKLE - COMPLETE 3+ VIEW COMPARISON:  None. FINDINGS: There is no evidence of an acute fracture, dislocation, or joint effusion. There is no evidence of arthropathy. 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 Mild to moderate severity anterior and lateral soft tissue swelling is seen. IMPRESSION: Anterior and lateral soft tissue swelling without evidence of acute fracture or dislocation. Electronically Signed   By: Aram Candela M.D.   On: 05/17/2020 22:46    Pertinent labs & imaging results that were available during my care of the patient were reviewed by me and considered in my medical decision making (see chart for details).  Medications Ordered in ED Medications  acetaminophen (TYLENOL) tablet 1,000 mg (has no administration in time range)  Procedures Procedures  (including critical care time)  Medical Decision Making / ED Course I have reviewed the nursing notes for this encounter and the patient's prior records (if available in EHR or on provided paperwork).   [facility] was evaluated in Emergency Department on 05/18/2020 for the symptoms described in the history of present illness. She was evaluated in the context of the global COVID-19 pandemic, which necessitated consideration that the patient might be at risk for infection with the SARS-CoV-2 virus that causes COVID-19. Institutional protocols and algorithms that pertain to the evaluation of patients at risk for COVID-19 are in a state of rapid change based on information  released by regulatory bodies including the CDC and federal and state organizations. These policies and algorithms were followed during the patient's care in the ED.  Plain film negative for acute fracture.  Most consistent with sprain.  Patient provided with cam walker and Tylenol.  No other injuries noted on exam requiring further work-up.      Final Clinical Impression(s) / ED Diagnoses Final diagnoses:  Sprain of anterior talofibular ligament of right ankle, initial encounter    The patient appears reasonably screened and/or stabilized for discharge and I doubt any other medical condition or other [facility] requiring further screening, evaluation, or treatment in the ED at this time prior to discharge. Safe for discharge with strict return precautions.  Disposition: Discharge  Condition: Good  I have discussed the results, Dx and Tx plan with the patient/family who expressed understanding and agree(s) with the plan. Discharge instructions discussed at length. The patient/family was given strict return precautions who verbalized understanding of the instructions. No further questions at time of discharge.    ED Discharge Orders    None        Follow Up: Loletta Specter, PA-C [address] [place] [address] [phone]  Call  To schedule an appointment for close follow up     This chart was dictated using voice recognition software.  Despite best efforts to proofread,  errors can occur which can change the documentation meaning.   Nira Conn, MD 05/18/20 [phone]

## 2020-05-18 NOTE — Discharge Instructions (Signed)
For pain control you may take at 1000 mg of Tylenol every 8 hours scheduled.    Para controlar el dolor, puede tomar 1000 mg de Tylenol cada 8 horas programadas.

## 2020-05-18 NOTE — ED Notes (Signed)
Ortho tech notified he will bring the cam walker

## 2020-05-18 NOTE — ED Notes (Signed)
The pt is 5 montghs pregnant she fell down some steps approx 2000 she has pain and swelling in her rt foot  She does remember when her last period was no other injuries

## 2020-05-29 ENCOUNTER — Other Ambulatory Visit: Payer: Self-pay

## 2020-05-29 ENCOUNTER — Encounter: Payer: Self-pay | Admitting: Obstetrics and Gynecology

## 2020-05-29 ENCOUNTER — Ambulatory Visit (INDEPENDENT_AMBULATORY_CARE_PROVIDER_SITE_OTHER): Payer: Self-pay | Admitting: Obstetrics and Gynecology

## 2020-05-29 VITALS — BP 99/64 | HR 93 | Wt 149.5 lb

## 2020-05-29 DIAGNOSIS — Z3A22 22 weeks gestation of pregnancy: Secondary | ICD-10-CM

## 2020-05-29 DIAGNOSIS — O0990 Supervision of high risk pregnancy, unspecified, unspecified trimester: Secondary | ICD-10-CM

## 2020-05-29 DIAGNOSIS — O09529 Supervision of elderly multigravida, unspecified trimester: Secondary | ICD-10-CM | POA: Insufficient documentation

## 2020-05-29 DIAGNOSIS — O43199 Other malformation of placenta, unspecified trimester: Secondary | ICD-10-CM | POA: Insufficient documentation

## 2020-05-29 LAB — POCT URINALYSIS DIP (DEVICE)
Bilirubin Urine: NEGATIVE
Glucose, UA: NEGATIVE mg/dL
Ketones, ur: NEGATIVE mg/dL
Nitrite: NEGATIVE
Protein, ur: NEGATIVE mg/dL
Specific Gravity, Urine: 1.025 (ref 1.005–1.030)
Urobilinogen, UA: 0.2 mg/dL (ref 0.0–1.0)
pH: 7 (ref 5.0–8.0)

## 2020-05-29 NOTE — Progress Notes (Signed)
Prenatal Visit Note Date: 05/29/2020 Clinic: [facility]  Subjective:  [facility] is a 39 y.o. G5P4004 at [redacted]w[redacted]d being seen today for ongoing prenatal care.  She is currently monitored for the following issues for this high-risk pregnancy and has Early GDM diagnosis; Supervision of high risk pregnancy, antepartum; Marginal insertion of umbilical cord affecting management of mother; and AMA (advanced maternal age) multigravida 35+ on their problem list.  Patient reports no complaints.   Contractions: Not present. Vag. Bleeding: None.  Movement: Present. Denies leaking of fluid.   The following portions of the patient's history were reviewed and updated as appropriate: allergies, current medications, past family history, past medical history, past social history, past surgical history and problem list. Problem list updated.  Objective:   Vitals:   05/29/20 0833  BP: 99/64  Pulse: 93  Weight: 149 lb 8 oz (67.8 kg)    Fetal Status: Fetal Heart Rate (bpm): 135   Movement: Present     General:  Alert, oriented and cooperative. Patient is in no acute distress.  Skin: Skin is warm and dry. No rash noted.   Cardiovascular: Normal heart rate noted  Respiratory: Normal respiratory effort, no problems with respiration noted  Abdomen: Soft, gravid, appropriate for gestational age. Pain/Pressure: Present     Pelvic:  Cervical exam deferred        Extremities: Normal range of motion.  Edema: None  Mental Status: Normal mood and affect. Normal behavior. Normal judgment and thought content.   Urinalysis:      Assessment and Plan:  Pregnancy: G5P4004 at [redacted]w[redacted]d  1. Supervision of high risk pregnancy, antepartum Routine care. More DM testing supplies given. Confirms qday ASA  2. Antepartum multigravida of advanced maternal age Declined genetics  3. Marginal cord insertion Will be getting regular u/s and delivery at 39wks due to GDM anyways  4. GDM (early dx) Normal  CBG log just on diet control. Has rpt growth on 12/16. Has 1/12 fetal echo and 2/1 eye appt pt reminded of  Preterm labor symptoms and general obstetric precautions including but not limited to vaginal bleeding, contractions, leaking of fluid and fetal movement were reviewed in detail with the patient. Please refer to After Visit Summary for other counseling recommendations.  Return in about 2 weeks (around 06/12/2020) for 2-3wk rob. app or md. in person .    Bing, MD

## 2020-06-08 ENCOUNTER — Ambulatory Visit: Payer: Self-pay | Admitting: *Deleted

## 2020-06-08 ENCOUNTER — Other Ambulatory Visit: Payer: Self-pay

## 2020-06-08 ENCOUNTER — Other Ambulatory Visit: Payer: Self-pay | Admitting: *Deleted

## 2020-06-08 ENCOUNTER — Encounter: Payer: Self-pay | Admitting: *Deleted

## 2020-06-08 ENCOUNTER — Ambulatory Visit: Payer: Self-pay | Attending: Obstetrics and Gynecology

## 2020-06-08 DIAGNOSIS — O321XX0 Maternal care for breech presentation, not applicable or unspecified: Secondary | ICD-10-CM

## 2020-06-08 DIAGNOSIS — Z3A23 23 weeks gestation of pregnancy: Secondary | ICD-10-CM

## 2020-06-08 DIAGNOSIS — O09522 Supervision of elderly multigravida, second trimester: Secondary | ICD-10-CM

## 2020-06-08 DIAGNOSIS — O43192 Other malformation of placenta, second trimester: Secondary | ICD-10-CM

## 2020-06-08 DIAGNOSIS — O24410 Gestational diabetes mellitus in pregnancy, diet controlled: Secondary | ICD-10-CM

## 2020-06-08 DIAGNOSIS — O0990 Supervision of high risk pregnancy, unspecified, unspecified trimester: Secondary | ICD-10-CM | POA: Insufficient documentation

## 2020-06-08 DIAGNOSIS — Z362 Encounter for other antenatal screening follow-up: Secondary | ICD-10-CM

## 2020-06-08 DIAGNOSIS — Z3682 Encounter for antenatal screening for nuchal translucency: Secondary | ICD-10-CM

## 2020-06-19 ENCOUNTER — Ambulatory Visit (INDEPENDENT_AMBULATORY_CARE_PROVIDER_SITE_OTHER): Payer: Self-pay | Admitting: Obstetrics & Gynecology

## 2020-06-19 ENCOUNTER — Other Ambulatory Visit: Payer: Self-pay

## 2020-06-19 VITALS — BP 114/54 | HR 92 | Wt 153.8 lb

## 2020-06-19 DIAGNOSIS — O0990 Supervision of high risk pregnancy, unspecified, unspecified trimester: Secondary | ICD-10-CM

## 2020-06-19 DIAGNOSIS — O24419 Gestational diabetes mellitus in pregnancy, unspecified control: Secondary | ICD-10-CM

## 2020-06-19 DIAGNOSIS — O09529 Supervision of elderly multigravida, unspecified trimester: Secondary | ICD-10-CM

## 2020-06-19 NOTE — Patient Instructions (Signed)

## 2020-06-19 NOTE — Progress Notes (Signed)
   PRENATAL VISIT NOTE  Subjective:  [facility] is a 39 y.o. G5P4004 at [redacted]w[redacted]d being seen today for ongoing prenatal care.  She is currently monitored for the following issues for this high-risk pregnancy and has Early GDM diagnosis; Supervision of high risk pregnancy, antepartum; Marginal insertion of umbilical cord affecting management of mother; and AMA (advanced maternal age) multigravida 35+ on their problem list.  Patient reports no complaints.  Contractions: Not present. Vag. Bleeding: None.  Movement: Present. Denies leaking of fluid.   The following portions of the patient's history were reviewed and updated as appropriate: allergies, current medications, past family history, past medical history, past social history, past surgical history and problem list.   Objective:   Vitals:   06/19/20 1013  BP: (!) 114/54  Pulse: 92  Weight: 153 lb 12.8 oz (69.8 kg)    Fetal Status: Fetal Heart Rate (bpm): 137   Movement: Present     General:  Alert, oriented and cooperative. Patient is in no acute distress.  Skin: Skin is warm and dry. No rash noted.   Cardiovascular: Normal heart rate noted  Respiratory: Normal respiratory effort, no problems with respiration noted  Abdomen: Soft, gravid, appropriate for gestational age.  Pain/Pressure: Absent     Pelvic: Cervical exam deferred        Extremities: Normal range of motion.  Edema: None  Mental Status: Normal mood and affect. Normal behavior. Normal judgment and thought content.   Assessment and Plan:  Pregnancy: G5P4004 at [redacted]w[redacted]d 1. Gestational diabetes mellitus (GDM), antepartum, gestational diabetes method of control unspecified Normal BG values very few out of range  2. Supervision of high risk pregnancy, antepartum F/u US is scheduled  3. Antepartum multigravida of advanced maternal age Has echocardiogram schedule with Dr. Elizebeth Brooking  Preterm labor symptoms and general obstetric precautions including but not limited to  vaginal bleeding, contractions, leaking of fluid and fetal movement were reviewed in detail with the patient. Please refer to After Visit Summary for other counseling recommendations.   No follow-ups on file.  Future Appointments  Date Time Provider Department Center  07/06/2020 11:15 AM [facility] NURSE [facility]  07/06/2020 11:30 AM [facility] US3 [facility]    Scheryl Darter, MD

## 2020-06-24 NOTE — L&D Delivery Note (Signed)
OB/GYN Faculty Practice Delivery Note  [facility] is a 40 y.o. now G5P5005 s/p SVD at [redacted]w[redacted]d who was admitted for active labor. She arrived to MAU with contractions every 3 minutes and 9.5 cm dilated. Prior to transfer to L&D, she found to be involuntarily pushing with SROM and baby's head crowning.  ROM: 0h 41m with clear fluid GBS Status: unknown Maximum Maternal Temperature: 97.9  Delivery Note At 2322 a viable female child was delivered via Vaginal, Spontaneous (Presentation: cephalic - Occiput Anterior).  APGAR: 9/9; weight pending.   Placenta status: Spontaneous, Intact.  Cord: 3 vessels with the following complications: none.  Cord pH: n/a  Anesthesia:  none Episiotomy:  none Lacerations: none  Suture Repair: none Est. Blood Loss (mL): 20   Postpartum Planning [x]  Mom to postpartum.  Baby to Couplet care / Skin to Skin. [x]  plans to breast feed [x]  message to sent to schedule follow-up  [ ]  vaccines - declined prenatally  , MSN, CNM 09/24/20, 11:32 PM

## 2020-07-06 ENCOUNTER — Ambulatory Visit: Payer: Self-pay

## 2020-07-06 ENCOUNTER — Ambulatory Visit: Payer: Self-pay | Attending: Obstetrics and Gynecology

## 2020-07-17 ENCOUNTER — Encounter: Payer: Self-pay | Admitting: Obstetrics and Gynecology

## 2020-07-28 ENCOUNTER — Other Ambulatory Visit: Payer: Self-pay

## 2020-07-28 ENCOUNTER — Ambulatory Visit (INDEPENDENT_AMBULATORY_CARE_PROVIDER_SITE_OTHER): Payer: Self-pay | Admitting: Obstetrics & Gynecology

## 2020-07-28 ENCOUNTER — Telehealth: Payer: Self-pay

## 2020-07-28 VITALS — BP 96/59 | HR 87 | Wt 154.6 lb

## 2020-07-28 DIAGNOSIS — Z603 Acculturation difficulty: Secondary | ICD-10-CM | POA: Insufficient documentation

## 2020-07-28 DIAGNOSIS — O0990 Supervision of high risk pregnancy, unspecified, unspecified trimester: Secondary | ICD-10-CM

## 2020-07-28 DIAGNOSIS — Z789 Other specified health status: Secondary | ICD-10-CM

## 2020-07-28 DIAGNOSIS — O43199 Other malformation of placenta, unspecified trimester: Secondary | ICD-10-CM

## 2020-07-28 DIAGNOSIS — O24419 Gestational diabetes mellitus in pregnancy, unspecified control: Secondary | ICD-10-CM

## 2020-07-28 NOTE — Progress Notes (Signed)
   PRENATAL VISIT NOTE  Subjective:  [facility] is a 40 y.o. G5P4004 at [redacted]w[redacted]d being seen today for ongoing prenatal care.  She is currently monitored for the following issues for this high-risk pregnancy and has Early GDM diagnosis; Supervision of high risk pregnancy, antepartum; Marginal insertion of umbilical cord affecting management of mother; AMA (advanced maternal age) multigravida 35+; and Language barrier on their problem list.  Patient reports no complaints.  Contractions: Not present. Vag. Bleeding: None.  Movement: Present. Denies leaking of fluid.   Pt is not checking blood sugars.  States they have been normal.  Brought form and values were normal but from over a month ago.  They are all good with fastings <100 and PP <120 but importance of having current numbers recommended.  She has supplies and does not need RFs for this.  The following portions of the patient's history were reviewed and updated as appropriate: allergies, current medications, past family history, past medical history, past social history, past surgical history and problem list.   Objective:   Vitals:   07/28/20 0919  BP: (!) 96/59  Pulse: 87  Weight: 154 lb 9.6 oz (70.1 kg)    Fetal Status: Fetal Heart Rate (bpm): 140   Movement: Present     General:  Alert, oriented and cooperative. Patient is in no acute distress.  Skin: Skin is warm and dry. No rash noted.   Cardiovascular: Normal heart rate noted  Respiratory: Normal respiratory effort, no problems with respiration noted  Abdomen: Soft, gravid, appropriate for gestational age.  Pain/Pressure: Absent     Pelvic: Cervical exam deferred        Extremities: Normal range of motion.  Edema: None  Mental Status: Normal mood and affect. Normal behavior. Normal judgment and thought content.   Assessment and Plan:  Pregnancy: G5P4004 at [redacted]w[redacted]d 1. Supervision of high risk pregnancy, antepartum - on PNV and ASA - HIV Antibody (routine testing w  rflx) - RPR - CBC - Hemoglobin A1c  2. Gestational diabetes mellitus (GDM), antepartum, gestational diabetes method of control unspecified - pt no showed 07/06/2020 appt - [place] [facility] OB FOLLOW UP; Future - Pt missed eye appt 07/25/20.  Was aware.  States did not have money for this.  Pt declines going for fetal echo due to cost - financial application was reviewed with pt today and translator helped with application  3. Marginal insertion of umbilical cord affecting management of mother  4.  Language barrier - Engineer, structural used throughout visit   Preterm labor symptoms and general obstetric precautions including but not limited to vaginal bleeding, contractions, leaking of fluid and fetal movement were reviewed in detail with the patient. Please refer to After Visit Summary for other counseling recommendations.   Return in about 2 weeks (around 08/11/2020) for Tdap.  Future Appointments  Date Time Provider Department Center  07/31/2020  1:45 PM [facility] NURSE [facility]  07/31/2020  2:00 PM [facility] US1 [facility]    Jerene Bears, MD

## 2020-07-28 NOTE — Progress Notes (Signed)
[facility] healthcare interpreter: Gean Maidens [phone]

## 2020-07-28 NOTE — Telephone Encounter (Signed)
Spoke with Erie Noe from [facility] to schedule their patient for a follow up ultrasound. Patient is being referred by Valentina Shaggy.   Was able to get the patient scheduled for 07/31/2020 at 1:45pm. She will notify the patient of the appointment information.

## 2020-07-31 ENCOUNTER — Ambulatory Visit: Payer: Self-pay | Attending: Obstetrics & Gynecology

## 2020-07-31 ENCOUNTER — Encounter: Payer: Self-pay | Admitting: *Deleted

## 2020-07-31 ENCOUNTER — Ambulatory Visit: Payer: Self-pay | Admitting: *Deleted

## 2020-07-31 ENCOUNTER — Other Ambulatory Visit: Payer: Self-pay

## 2020-07-31 DIAGNOSIS — O24419 Gestational diabetes mellitus in pregnancy, unspecified control: Secondary | ICD-10-CM

## 2020-07-31 DIAGNOSIS — O0990 Supervision of high risk pregnancy, unspecified, unspecified trimester: Secondary | ICD-10-CM

## 2020-07-31 DIAGNOSIS — O43193 Other malformation of placenta, third trimester: Secondary | ICD-10-CM

## 2020-07-31 DIAGNOSIS — Z3A31 31 weeks gestation of pregnancy: Secondary | ICD-10-CM

## 2020-07-31 DIAGNOSIS — O24410 Gestational diabetes mellitus in pregnancy, diet controlled: Secondary | ICD-10-CM

## 2020-07-31 DIAGNOSIS — O09523 Supervision of elderly multigravida, third trimester: Secondary | ICD-10-CM

## 2020-08-01 ENCOUNTER — Other Ambulatory Visit: Payer: Self-pay | Admitting: *Deleted

## 2020-08-01 ENCOUNTER — Telehealth: Payer: Self-pay

## 2020-08-01 DIAGNOSIS — O43193 Other malformation of placenta, third trimester: Secondary | ICD-10-CM

## 2020-08-01 LAB — CBC
Hematocrit: 32.9 % — ABNORMAL LOW (ref 34.0–46.6)
Hemoglobin: 11 g/dL — ABNORMAL LOW (ref 11.1–15.9)
MCH: 28.5 pg (ref 26.6–33.0)
MCHC: 33.4 g/dL (ref 31.5–35.7)
MCV: 85 fL (ref 79–97)
Platelets: 249 10*3/uL (ref 150–450)
RBC: 3.86 x10E6/uL (ref 3.77–5.28)
RDW: 12.3 % (ref 11.7–15.4)
WBC: 6.1 10*3/uL (ref 3.4–10.8)

## 2020-08-01 LAB — HIV ANTIBODY (ROUTINE TESTING W REFLEX): HIV Screen 4th Generation wRfx: NONREACTIVE

## 2020-08-01 LAB — HEMOGLOBIN A1C
Est. average glucose Bld gHb Est-mCnc: 105 mg/dL
Hgb A1c MFr Bld: 5.3 % (ref 4.8–5.6)

## 2020-08-01 LAB — RPR: RPR Ser Ql: NONREACTIVE

## 2020-08-01 MED ORDER — FERROUS SULFATE 325 (65 FE) MG PO TABS: 325.0000 mg | ORAL_TABLET | ORAL | 3 refills | Status: DC

## 2020-08-01 NOTE — Telephone Encounter (Addendum)
-----   Message from Jerene Bears, MD sent at 08/01/2020  4:35 PM EST ----- Please let pt know her HIV and RPR were normal.  Her hemoglobin is 11.0 and she needs to start taking FeSo4 325mg  every other day to help prevent this from worsening.  Also, her HbA1C was 5.3 which is not elevated.  She has gestational DM but hasn't been checking blood sugars.  This normal hbA1C helps know that her blood sugars are under good control.  However, I did ask her to start taking them again and bring results with her to [facility] visit.  Please remind her.  Thank you.  Called pt with [facility] # [phone] and informed pt  Providers recommendation and that iron tablet has been sent to her [facility] on [address].  Pt verbalized understanding.   [employer]  08/01/20

## 2020-08-11 ENCOUNTER — Other Ambulatory Visit: Payer: Self-pay

## 2020-08-11 ENCOUNTER — Ambulatory Visit (INDEPENDENT_AMBULATORY_CARE_PROVIDER_SITE_OTHER): Payer: Self-pay | Admitting: Medical

## 2020-08-11 ENCOUNTER — Encounter: Payer: Self-pay | Admitting: *Deleted

## 2020-08-11 ENCOUNTER — Encounter: Payer: Self-pay | Admitting: Medical

## 2020-08-11 VITALS — BP 104/61 | HR 88 | Wt 154.5 lb

## 2020-08-11 DIAGNOSIS — O09523 Supervision of elderly multigravida, third trimester: Secondary | ICD-10-CM

## 2020-08-11 DIAGNOSIS — O43199 Other malformation of placenta, unspecified trimester: Secondary | ICD-10-CM

## 2020-08-11 DIAGNOSIS — Z3A33 33 weeks gestation of pregnancy: Secondary | ICD-10-CM

## 2020-08-11 DIAGNOSIS — O0990 Supervision of high risk pregnancy, unspecified, unspecified trimester: Secondary | ICD-10-CM

## 2020-08-11 DIAGNOSIS — Z758 Other problems related to medical facilities and other health care: Secondary | ICD-10-CM

## 2020-08-11 DIAGNOSIS — Z789 Other specified health status: Secondary | ICD-10-CM

## 2020-08-11 DIAGNOSIS — O24419 Gestational diabetes mellitus in pregnancy, unspecified control: Secondary | ICD-10-CM

## 2020-08-11 LAB — POCT URINALYSIS DIP (DEVICE)
Bilirubin Urine: NEGATIVE
Glucose, UA: NEGATIVE mg/dL
Hgb urine dipstick: NEGATIVE
Ketones, ur: NEGATIVE mg/dL
Leukocytes,Ua: NEGATIVE
Nitrite: NEGATIVE
Protein, ur: NEGATIVE mg/dL
Specific Gravity, Urine: 1.015 (ref 1.005–1.030)
Urobilinogen, UA: 0.2 mg/dL (ref 0.0–1.0)
pH: 6.5 (ref 5.0–8.0)

## 2020-08-11 MED ORDER — FERROUS SULFATE 325 (65 FE) MG PO TABS: 325.0000 mg | ORAL_TABLET | ORAL | 3 refills | Status: DC

## 2020-08-11 NOTE — Patient Instructions (Addendum)
Evaluacin de los movimientos fetales Fetal Movement Counts Nombre del paciente: ________________________________________________ Destiny Cooper estimada: ____________________  Destiny Cooper evaluacin de los movimientos fetales? Una evaluacin de los movimientos fetales es el registro del nmero de veces que siente que el beb se mueve durante un cierto perodo de Destiny Cooper. Esto tambin se puede denominar recuento de patadas fetales. Una evaluacin de movimientos fetales se recomienda a todas las embarazadas. Es posible que le indiquen que comience a Destiny Cooper, community los movimientos fetales desde la semana 28 de Destiny Cooper. Preste atencin cuando sienta que el beb est ms activo. Podr detectar los ciclos en que el beb duerme y est despierto. Tambin podr detectar que ciertas cosas hacen que su beb se mueva ms. Deber realizar una evaluacin de los movimientos fetales en las siguientes situaciones:  Cuando el beb est ms activo habitualmente.  A la [employer], todos los [place]. Un buen momento para evaluar los movimientos fetales es cuando est descansando, despus de haber comido y bebido algo. Cmo debo contar los movimientos fetales? 1. Encuentre un lugar tranquilo y cmodo. Sintese o acustese de lado. 2. Anote la fecha, la hora de Cooper y de Cooper y la cantidad de movimientos que sinti entre esas dos horas. Lleve esta informacin a las visitas de control. 3. Anote la hora de Cooper cuando Designer, industrial/product. 4. Cuente las pataditas, revoloteos, chasquidos, vueltas o pinchazos. Debe sentir al menos . 5. Puede dejar de contar despus de haber sentido 10 movimientos o de haber contado [employer]. Anote la hora de Cooper. 6. Si no siente en 2horas, comunquese con su mdico para obtener ms indicaciones. Es posible que el mdico quiera realizar estudios adicionales para Company secretary del beb. Comunquese con un mdico si:  Siente  menos de en 2horas.  El beb no se mueve tanto como suele hacerlo. Fecha: Cooper Destiny Cooper: Cooper Destiny Cooper Movimientos: Cooper Destiny Cooper: Cooper Destiny Cooper: Cooper Destiny Cooper Movimientos: Cooper Destiny Cooper: Cooper Destiny Cooper: Cooper Destiny Cooper Movimientos: Cooper Destiny Cooper: Cooper Destiny Cooper: Cooper Destiny Cooper Movimientos: Cooper Destiny Cooper: Cooper Destiny Cooper: Cooper Destiny Cooper: Cooper Movimientos: Cooper Destiny Cooper: Cooper Destiny Cooper: Cooper Destiny Cooper: Cooper Movimientos: Cooper Destiny Cooper: Cooper Destiny Cooper: Cooper Destiny Cooper: Cooper Movimientos: Cooper Destiny Cooper: Cooper Destiny Cooper: Cooper Destiny Cooper Movimientos: Cooper Destiny Cooper: Cooper Destiny Cooper: Cooper Destiny Cooper: Cooper Movimientos: Cooper Esta informacin no tiene como fin reemplazar el consejo del mdico. Asegrese de hacerle al mdico cualquier pregunta que tenga. Document Revised: 04/06/2019 Document Reviewed: 04/06/2019 Elsevier Patient Education  2021 Elsevier Inc. Rosen's Emergency Medicine: Concepts and Clinical Practice (9th ed., pp. 2296- 2312). Elsevier.">  Contracciones de [employer] Destiny Cooper Las contracciones del tero pueden presentarse durante todo el Destiny Cooper no siempre indican que la mujer est de [place]. Es posible que usted haya tenido contracciones de prctica llamadas "contracciones de [place]". A veces, se las confunde con el parto real. Qu son las contracciones de [place]? Las contracciones de Destiny Cooper son espasmos que se producen en los msculos del tero antes del Destiny Cooper. A diferencia de las contracciones del  parto verdadero, estas no producen el agrandamiento (la dilatacin) ni el afinamiento del cuello uterino. Hacia el final del embarazo Destiny Cooper las semanas [phone]), las contracciones de Destiny Hicks pueden presentarse ms seguido y tornarse ms intensas. A veces, resulta difcil distinguirlas del  parto verdadero porque pueden ser Destiny Cooper. No debe sentirse avergonzada si concurre al hospital con falso parto. En ocasiones, la nica forma de saber si el trabajo de parto es verdadero es que el mdico determine si hay cambios en el cuello del tero. El Destiny Cooper har un examen fsico y Destiny Cooper controle las contracciones. Si usted no est de Systems developer, el examen debe indicar que el cuello uterino no est dilatado y que usted no ha roto Barista. Si no hay otros problemas de salud asociados con su embarazo, no habr inconvenientes si la envan a su casa con un falso parto. Es posible que las contracciones de Destiny Hicks continen hasta que se desencadene el parto verdadero. Cmo diferenciar el Destiny Cooper de parto falso del verdadero Trabajo de parto verdadero  Las contracciones duran de [place].  Las contracciones pueden tornarse muy regulares.  La molestia generalmente se siente en la parte superior del tero y se extiende hacia la zona baja del abdomen y Parker Cooper cintura.  Las contracciones no desaparecen cuando usted camina.  Las contracciones generalmente se hacen ms intensas y Comptroller.  El cuello uterino se dilata y se afina. Parto falso  En general, las contracciones son ms cortas y no tan intensas como las del parto verdadero.  En general, las contracciones son irregulares.  A menudo, las contracciones se sienten en la parte delantera de la parte baja del abdomen y en la ingle.  Las Futures trader cuando usted camina o [place] de posicin mientras est [place].  Las contracciones se vuelven ms dbiles y su duracin es menor a medida que  transcurre [employer].  En general, el cuello uterino no se dilata ni se afina. Siga estas indicaciones en su casa:  Tome los medicamentos de venta libre y los recetados solamente como se lo haya indicado el mdico.  Contine haciendo los ejercicios habituales y siga las dems indicaciones que el mdico le d.  Coma y beba con moderacin si cree que est de parto.  Si las contracciones de Destiny Cooper provocan incomodidad: ? Cambie de posicin: si est acostada o descansando, camine; si est caminando, descanse. ? Sintese y descanse en una baera con agua tibia. ? Beba suficiente lquido como para mantener la orina de color amarillo plido. La deshidratacin puede provocar contracciones. ? Respire lenta y profundamente varias veces por hora.  Vaya a todas las visitas de control prenatales y de control como se lo haya indicado el mdico. Esto es importante.   Comunquese con un mdico si:  Tiene fiebre.  Siente dolor constante en el abdomen. Solicite ayuda de inmediato si:  Las contracciones se intensifican, se hacen ms regulares y Arboriculturist s.  Tiene una prdida de lquido por la vagina.  Elimina una mucosidad sanguinolenta (prdida del tapn mucoso).  Tiene una hemorragia vaginal.  Tiene un dolor en la zona lumbar que nunca tuvo antes.  Siente que la cabeza del beb empuja hacia abajo y ejerce presin en la zona plvica.  El beb no se mueve tanto como antes. Resumen  Las [employer] se presentan antes del parto se conocen como contracciones de [place] parto o contracciones de Multimedia programmer.  En general, las contracciones de [address] son ms cortas, ms dbiles, con ms tiempo entre una y [place], y menos regulares que las contracciones del parto verdadero. Las contracciones del parto verdadero se intensifican progresivamente y se tornan regulares y ms frecuentes.  Para controlar la [employer]  producen las contracciones de [place],  puede cambiar de posicin, darse un bao templado y Lawyerdescansar, beber mucha agua o practicar la respiracin profunda. Esta informacin no tiene Theme Destiny managercomo fin reemplazar el consejo del mdico. Asegrese de hacerle al mdico cualquier pregunta que tenga. Document Revised: 09/19/2017 Document Reviewed: 01/20/2017 Elsevier Patient Education  2021 [employer]vier Inc.  Places to have your son circumcised (for self-pay patients//Medicaid now covers circumcisions):                                                                      [facility]     [phone]   $480 while you are in hospital         [facility]              [phone]   $269 by 4 wks                      [facility]                     [phone]   $269 by 7 days [facility]                    [phone]   $269 by 4 wks [facility]             [phone]   $225 by 2 wks    These prices sometimes change but are roughly what you can expect to pay. Please call and confirm pricing.   There are many reasons parents decide to have their sons circumsized. During the first year of life circumcised males have a reduced risk of urinary tract infections but after this year the rates between circumcised males and uncircumcised males are the same.  It can reduce rate of HIV and other STI infection later in life.  It is safe to have your son circumcised outside of the hospital and the places above perform them regularly.   Deciding about Circumcision in Baby Boys  (Up-to-date The Basics)  What is circumcision?   Circumcision is a surgery that removes the skin that covers the tip of the penis, called the "foreskin" Circumcision is usually done when a boy is between 681 and 6610 days old. In the [place], circumcision is common. In some other countries, fewer boys are circumcised. Circumcision is a common tradition in some religions.  Should I have my baby boy circumcised?   There is no easy answer. Circumcision has some benefits. But it also has risks. After talking with  your doctor, you will have to decide for yourself what is right for your family.  What are the benefits of circumcision?   Circumcised boys seem to have slightly lower rates of: ?Urinary tract infections ?Swelling of the opening at the tip of the penis Circumcised men seem to have slightly lower rates of: ?Urinary tract infections ?Swelling of the opening at the tip of the penis ?Penis cancer ?HIV and other infections that you catch during sex, or transmit to a future partner(s) ?Cervical cancer in the women they have sex with Even so, in the [place], the risks of these problems are small - even in boys and men who have not been circumcised. Plus,  boys and men who are not circumcised can reduce these extra risks by: ?Cleaning their penis well ?Using condoms during sex  What are the risks of circumcision?  Risks include: ?Bleeding or infection from the surgery ?Damage to or amputation of the penis ?A chance that the doctor will cut off too much or not enough of the foreskin ?A chance that sex won't feel as good later in life Only about 1 out of every 200 circumcisions leads to problems. There is also a chance that your health insurance won't pay for circumcision.  How is circumcision done in baby boys?  First, the baby gets medicine for pain relief. This might be a cream on the skin or a shot into the base of the penis. Next, the doctor cleans the baby's penis well. Then he or she uses special tools to cut off the foreskin. Finally, the doctor wraps a bandage (called gauze) around the baby's penis. If you have your baby circumcised, his doctor or nurse will give you instructions on how to care for him after the surgery. It is important that you follow those instructions carefully.

## 2020-08-11 NOTE — Progress Notes (Signed)
   PRENATAL VISIT NOTE  Subjective:  Destiny Cooper is a 40 y.o. G5P4004 at [redacted]w[redacted]d being seen today for ongoing prenatal care.  She is currently monitored for the following issues for this high-risk pregnancy and has Early GDM diagnosis; Supervision of high risk pregnancy, antepartum; Marginal insertion of umbilical cord affecting management of mother; AMA (advanced maternal age) multigravida 35+; and Language barrier on their problem list.  Patient reports no complaints.  Contractions: Not present. Vag. Bleeding: None.  Movement: Present. Denies leaking of fluid.   The following portions of the patient's history were reviewed and updated as appropriate: allergies, current medications, past family history, past medical history, past social history, past surgical history and problem list.   Objective:   Vitals:   08/11/20 0937  BP: 104/61  Pulse: 88  Weight: 154 lb 8 oz (70.1 kg)    Fetal Status: Fetal Heart Rate (bpm): 145 Fundal Height: 32 cm Movement: Present     General:  Alert, oriented and cooperative. Patient is in no acute distress.  Skin: Skin is warm and dry. No rash noted.   Cardiovascular: Normal heart rate noted  Respiratory: Normal respiratory effort, no problems with respiration noted  Abdomen: Soft, gravid, appropriate for gestational age.  Pain/Pressure: Absent     Pelvic: Cervical exam deferred        Extremities: Normal range of motion.  Edema: None  Mental Status: Normal mood and affect. Normal behavior. Normal judgment and thought content.   Assessment and Plan:  Pregnancy: G5P4004 at [redacted]w[redacted]d 1. Supervision of high risk pregnancy, antepartum - ferrous sulfate 325 (65 FE) MG tablet; Take 1 tablet (325 mg total) by mouth every other day.  Dispense: 30 tablet; Refill: 3 - Declined TDAP - Discussed Circ, still unsure, info included on AVS  2. Gestational diabetes mellitus (GDM) in third trimester, gestational diabetes method of control unspecified - Growth [place]  scheduled 08/29/20 - Diet controlled - Not checking CBGs regularly, log is very sporadic, advised to check regularly and bring log to each visit - Hgb A1c at last visit likely due to lack of values on log was normal  - CBGs documented are normal   3. Multigravida of advanced maternal age in third trimester - < 40, no change  4. Marginal insertion of umbilical cord affecting management of mother  5. Language barrier - Interpreter present   6. [redacted] weeks gestation of pregnancy  Preterm labor symptoms and general obstetric precautions including but not limited to vaginal bleeding, contractions, leaking of fluid and fetal movement were reviewed in detail with the patient. Please refer to After Visit Summary for other counseling recommendations.   Return in about 2 weeks (around 08/25/2020) for [facility] APP, In-Person.  Future Appointments  Date Time Provider Department Center  08/24/2020  9:55 AM Warden Fillers, MD [facility]  08/29/2020  7:45 AM [facility] NURSE [facility]  08/29/2020  8:00 AM [facility] US1 [facility]    Vonzella Nipple, PA-C

## 2020-08-21 ENCOUNTER — Telehealth: Payer: Self-pay

## 2020-08-21 NOTE — Telephone Encounter (Signed)
GFE sent to patient via MyChart for their upcoming appointment

## 2020-08-24 ENCOUNTER — Encounter: Payer: Self-pay | Admitting: Obstetrics and Gynecology

## 2020-08-29 ENCOUNTER — Ambulatory Visit: Payer: Self-pay

## 2020-09-24 ENCOUNTER — Other Ambulatory Visit: Payer: Self-pay

## 2020-09-24 ENCOUNTER — Inpatient Hospital Stay (HOSPITAL_COMMUNITY)
Admission: AD | Admit: 2020-09-24 | Discharge: 2020-09-26 | DRG: 807 | Disposition: A | Discharge status: Home / Self Care | Payer: Medicaid Other | Attending: Obstetrics and Gynecology | Admitting: Obstetrics and Gynecology

## 2020-09-24 ENCOUNTER — Encounter (HOSPITAL_COMMUNITY): Payer: Self-pay | Admitting: Obstetrics and Gynecology

## 2020-09-24 DIAGNOSIS — Z20822 Contact with and (suspected) exposure to covid-19: Secondary | ICD-10-CM | POA: Diagnosis present

## 2020-09-24 DIAGNOSIS — O26893 Other specified pregnancy related conditions, third trimester: Secondary | ICD-10-CM | POA: Diagnosis present

## 2020-09-24 DIAGNOSIS — O24420 Gestational diabetes mellitus in childbirth, diet controlled: Principal | ICD-10-CM | POA: Diagnosis present

## 2020-09-24 DIAGNOSIS — Z789 Other specified health status: Secondary | ICD-10-CM | POA: Diagnosis present

## 2020-09-24 DIAGNOSIS — Z3A39 39 weeks gestation of pregnancy: Secondary | ICD-10-CM | POA: Diagnosis not present

## 2020-09-24 DIAGNOSIS — O24430 Gestational diabetes mellitus in the puerperium, diet controlled: Secondary | ICD-10-CM | POA: Diagnosis not present

## 2020-09-24 DIAGNOSIS — Z603 Acculturation difficulty: Secondary | ICD-10-CM | POA: Diagnosis present

## 2020-09-24 DIAGNOSIS — O43123 Velamentous insertion of umbilical cord, third trimester: Secondary | ICD-10-CM | POA: Diagnosis present

## 2020-09-24 DIAGNOSIS — O24419 Gestational diabetes mellitus in pregnancy, unspecified control: Secondary | ICD-10-CM | POA: Diagnosis present

## 2020-09-24 DIAGNOSIS — O43199 Other malformation of placenta, unspecified trimester: Secondary | ICD-10-CM | POA: Diagnosis present

## 2020-09-24 DIAGNOSIS — O09529 Supervision of elderly multigravida, unspecified trimester: Secondary | ICD-10-CM

## 2020-09-24 MED ORDER — LIDOCAINE HCL (PF) 1 % IJ SOLN
INTRAMUSCULAR | Status: AC
  Filled 2020-09-24: qty 30

## 2020-09-24 MED ORDER — ERYTHROMYCIN 5 MG/GM OP OINT
TOPICAL_OINTMENT | OPHTHALMIC | Status: AC
  Filled 2020-09-24: qty 1

## 2020-09-24 MED ORDER — OXYTOCIN 10 UNIT/ML IJ SOLN
INTRAMUSCULAR | Status: AC
  Administered 2020-09-25: 10 [IU] via INTRAMUSCULAR
  Filled 2020-09-24: qty 1

## 2020-09-24 MED ORDER — LACTATED RINGERS IV SOLN: INTRAVENOUS | Status: DC

## 2020-09-24 MED ORDER — ACETAMINOPHEN 325 MG PO TABS
650.0000 mg | ORAL_TABLET | ORAL | Status: DC | PRN
  Administered 2020-09-25: 650 mg via ORAL
  Filled 2020-09-24: qty 2

## 2020-09-24 MED ORDER — OXYTOCIN 10 UNIT/ML IJ SOLN: 10.0000 [IU] | Freq: Once | INTRAMUSCULAR | Status: AC

## 2020-09-24 NOTE — H&P (Shared)
OBSTETRIC ADMISSION HISTORY AND PHYSICAL  Destiny Cooper is a 40 y.o. female [phone] with IUP at [redacted]w[redacted]d by *** presenting for ***. She reports +FMs, No LOF, no VB, no blurry vision, headaches or peripheral edema, and RUQ pain.  She plans on *** feeding. She request *** for birth control. She received her prenatal care at {Blank single:19197::[facility]"}   Dating: By *** --->  Estimated Date of Delivery: 09/29/20  Sono:    @***w***d, CWD, normal anatomy, *** presentation, *** lie, ***g, ***% EFW   Prenatal History/Complications: ***  Past Medical History: Past Medical History:  Diagnosis Date  . Abnormal Pap smear   . Active labor at term 04/14/2012  . History of UTI   . Murmur   . Postpartum care following vaginal delivery 06/15/2014  . Vaginal delivery 04/14/2012  . Vaginal Pap smear, abnormal     Past Surgical History: Past Surgical History:  Procedure Laterality Date  . NO PAST SURGERIES      Obstetrical History: OB History    Gravida  5   Para  4   Term  4   Preterm      AB      Living  4     SAB      IAB      Ectopic      Multiple  0   Live Births  4           Social History Social History   Socioeconomic History  . Marital status: Married    Spouse name: Not on file  . Number of children: Not on file  . Years of education: Not on file  . Highest education level: Not on file  Occupational History  . Not on file  Tobacco Use  . Smoking status: Never Smoker  . Smokeless tobacco: Never Used  Vaping Use  . Vaping Use: Never used  Substance and Sexual Activity  . Alcohol use: Not Currently  . Drug use: No  . Sexual activity: Yes    Birth control/protection: None  Other Topics Concern  . Not on file  Social History Narrative  . Not on file   Social Determinants of Health   Financial Resource Strain: Not on file  Food Insecurity: No Food Insecurity  . Worried About Programme researcher, broadcasting/film/video in the Last Year:  Never true  . Ran Out of Food in the Last Year: Never true  Transportation Needs: No Transportation Needs  . Lack of Transportation (Medical): No  . Lack of Transportation (Non-Medical): No  Physical Activity: Not on file  Stress: Not on file  Social Connections: Not on file    Family History: Family History  Problem Relation Age of Onset  . Cancer Neg Hx   . Diabetes Neg Hx   . Heart disease Neg Hx   . Hypertension Neg Hx     Allergies: No Known Allergies  Medications Prior to Admission  Medication Sig Dispense Refill Last Dose  . aspirin EC 81 MG tablet Take 1 tablet (81 mg total) by mouth daily. Swallow whole. 30 tablet 11 09/24/2020 at Unknown time  . ferrous sulfate 325 (65 FE) MG tablet Take 1 tablet (325 mg total) by mouth every other day. 30 tablet 3 09/24/2020 at Unknown time  . Prenatal Vit w/Fe-Methylfol-FA (PNV PO) Take by mouth.   09/24/2020 at Unknown time     Review of Systems   All systems reviewed and negative except as stated in HPI  Blood  pressure 122/73, pulse 78, temperature 97.9 F (36.6 C), temperature source Oral, resp. rate 18, height 5\' 2"  (1.575 m), weight 73.2 kg, last menstrual period 12/24/2019. General appearance: {general exam:16600} Lungs: clear to auscultation bilaterally Heart: regular rate and rhythm Abdomen: soft, non-tender; bowel sounds normal Pelvic: *** Extremities: Homans sign is negative, no sign of DVT DTR's *** Presentation: {desc; fetal presentation:14558} Fetal monitoring{findings; monitor fetal heart monitor:31527} Uterine activity{Uterine contractions:31516} Dilation: Lip/rim   Prenatal labs: ABO, Rh: O/Positive/-- (09/30 0000) Antibody: Negative (09/30 0000) Rubella: Immune (09/30 0000) RPR: Non Reactive (02/04 0932)  HBsAg:    HIV: Non Reactive (02/04 0932)  GBS:    1 hr Glucola *** Genetic screening  *** Anatomy 11-21-2000 ***  Prenatal Transfer Tool  Maternal Diabetes: {Maternal Diabetes:3043596} Genetic Screening:  {Genetic Screening:20205} Maternal Ultrasounds/Referrals: {Maternal Ultrasounds / Referrals:20211} Fetal Ultrasounds or other Referrals:  {Fetal Ultrasounds or Other Referrals:20213} Maternal Substance Abuse:  {Maternal Substance Abuse:20223} Significant Maternal Medications:  {Significant Maternal Meds:20233} Significant Maternal Lab Results: {Significant Maternal Lab Results:20235}  No results found for this or any previous visit (from the past 24 hour(s)).  Patient Active Problem List   Diagnosis Date Noted  . Language barrier 07/28/2020  . Marginal insertion of umbilical cord affecting management of mother 05/29/2020  . AMA (advanced maternal age) multigravida 35+ 05/29/2020  . Supervision of high risk pregnancy, antepartum 04/10/2020  . Early GDM diagnosis 04/04/2020    Assessment/Plan:  Destiny Cooper is a 40 y.o. G5P4004 at [redacted]w[redacted]d here for labor.  #Labor:  expectantmgmt #Pain: *** #FWB: *** #ID:  *** #MOF: *** #MOC:*** #Circ:  ***  [redacted]w[redacted]d, MD  09/24/2020, 11:19 PM

## 2020-09-24 NOTE — H&P (Signed)
OBSTETRIC ADMISSION HISTORY AND PHYSICAL  Destiny Cooper is a 40 y.o. female [phone] with IUP at [redacted]w[redacted]d by LMP presenting for delivery.   Reports fetal movement. Denies vaginal bleeding.  She received her prenatal care at [facility] from [facility].  Support person in labor: spouse  Ultrasounds . Anatomy U/S: MCI  Prenatal History/Complications: . AMA . GDM . Multigravida . Marginal Cord Insertion . Language Barrier . Unknown GBS status  OB BOX:  Nursing Staff Provider  Office Location [facility] Dating  LMP  Language  Spanish Anatomy US  Normal  Flu Vaccine  Declined 05/01/20 Genetic Screen  NIPS:  AFP: declined  NT [place] normal   TDaP Vaccine   Declined Hgb A1C or  GTT Early  Third trimester   COVID Vaccine No   LAB RESULTS   Rhogam  NA Blood Type O/Positive/-- (09/30 0000)   Feeding Plan Breast Antibody Negative (09/30 0000)  Contraception Undecided Rubella Immune (09/30 0000)  Circumcision Undecided RPR Non Reactive (02/04 0932)   Pediatrician  [facility] HBsAg  Non-Reactive  Support Person Husband HCVAb   Prenatal Classes  HIV Non Reactive (02/04 0932)     BTL Consent NA GBS Unknown (For PCN allergy, check sensitivities)   VBAC Consent NA Pap Normal    Hgb Electro    BP Cuff In-person CF     SMA     Waterbirth  [ ]  Class [ ]  Consent [ ]  CNM visit    Induction  [ ]  Orders Entered [ ] Foley Y/N   Past Medical History: Past Medical History:  Diagnosis Date  . Abnormal Pap smear   . Active labor at term 04/14/2012  . History of UTI   . Murmur   . Postpartum care following vaginal delivery 06/15/2014  . Vaginal delivery 04/14/2012  . Vaginal Pap smear, abnormal     Past Surgical History: Past Surgical History:  Procedure Laterality Date  . NO PAST SURGERIES      Obstetrical History: OB History    Gravida  5   Para  4   Term  4   Preterm      AB      Living  4     SAB      IAB      Ectopic      Multiple  0   Live Births  4            Social History: Social History   Socioeconomic History  . Marital status: Married    Spouse name: Not on file  . Number of children: Not on file  . Years of education: Not on file  . Highest education level: Not on file  Occupational History  . Not on file  Tobacco Use  . Smoking status: Never Smoker  . Smokeless tobacco: Never Used  Vaping Use  . Vaping Use: Never used  Substance and Sexual Activity  . Alcohol use: Not Currently  . Drug use: No  . Sexual activity: Yes    Birth control/protection: None  Other Topics Concern  . Not on file  Social History Narrative  . Not on file   Social Determinants of Health   Financial Resource Strain: Not on file  Food Insecurity: No Food Insecurity  . Worried About in the Last Year: Never true  . Ran Out of Food in the Last Year: Never true  Transportation Needs: No Transportation Needs  . Lack of Transportation (Medical): No  .  Lack of Transportation (Non-Medical): No  Physical Activity: Not on file  Stress: Not on file  Social Connections: Not on file    Family History: Family History  Problem Relation Age of Onset  . Cancer Neg Hx   . Diabetes Neg Hx   . Heart disease Neg Hx   . Hypertension Neg Hx     Allergies: No Known Allergies  Medications Prior to Admission  Medication Sig Dispense Refill Last Dose  . aspirin EC 81 MG tablet Take 1 tablet (81 mg total) by mouth daily. Swallow whole. 30 tablet 11 09/24/2020 at Unknown time  . ferrous sulfate 325 (65 FE) MG tablet Take 1 tablet (325 mg total) by mouth every other day. 30 tablet 3 09/24/2020 at Unknown time  . Prenatal Vit w/Fe-Methylfol-FA (PNV PO) Take by mouth.   09/24/2020 at Unknown time     Review of Systems  All systems reviewed and negative except as stated in HPI  Blood pressure 122/73, pulse 78, temperature 97.9 F (36.6 C), temperature source Oral, resp. rate 18, height 5\' 2"  (1.575 m), weight 73.2 kg, last menstrual period  12/24/2019. General appearance: alert, cooperative and moderate distress Lungs: no respiratory distress Heart: regular rate  Abdomen: soft, non-tender; gravid b Pelvic: adequate Extremities: Homans sign is negative, no sign of DVT Presentation: cephalic Fetal monitoring: 130 bpm / moderate variability / accels present / decels absent Uterine activity: regular every 3 minutes per patient Dilation: Lip/rim  Prenatal labs: ABO, Rh: O/Positive/-- (09/30 0000) Antibody: Negative (09/30 0000) Rubella: Immune (09/30 0000) RPR: Non Reactive (02/04 0932)  HBsAg:  Non-Reactive HIV: Non Reactive (02/04 0932)  GBS:  Unknown  Glucola: early dx of GDM Genetic screening:  declined  Prenatal Transfer Tool  Maternal Diabetes: Yes:  Diabetes Type:  Diet controlled Genetic Screening: Declined Maternal Ultrasounds/Referrals: Normal and Other: MCI Fetal Ultrasounds or other Referrals:  None Maternal Substance Abuse:  No Significant Maternal Medications:  None Significant Maternal Lab Results: Other: GBS Unknown  No results found for this or any previous visit (from the past 24 hour(s)).  Patient Active Problem List   Diagnosis Date Noted  . Language barrier 07/28/2020  . Marginal insertion of umbilical cord affecting management of mother 05/29/2020  . AMA (advanced maternal age) multigravida 35+ 05/29/2020  . Supervision of high risk pregnancy, antepartum 04/10/2020  . Early GDM diagnosis 04/04/2020    Assessment/Plan:  Destiny Cooper is a 40 y.o. G5P4004 at [redacted]w[redacted]d here for active labor and advanced dilation  Labor: active -- pain control: none  Fetal Wellbeing: EFW 7 lbs by Leopold's. Cephalic by exam.  -- GBS (unknown) -- continuous fetal monitoring - category 1   Postpartum Planning -- breast   [redacted]w[redacted]d, CNM  09/24/2020, 11:52 PM

## 2020-09-24 NOTE — Clinical Note (Incomplete)
2322: baby delivered Skin to skin  Placenta delivered at 2327  Pit at 2329  Eyes at 2345

## 2020-09-24 NOTE — MAU Note (Signed)
..  Destiny Cooper is a 40 y.o. at [redacted]w[redacted]d here in MAU reporting: Contractions every 3 minutes. Denies vaginal bleeding or leaking of fluid. +FM

## 2020-09-24 NOTE — H&P (Incomplete)
OBSTETRIC ADMISSION HISTORY AND PHYSICAL  Destiny Cooper is a 40 y.o. female [phone] with IUP at [redacted]w[redacted]d by LMP presenting for delivery.   Reports fetal movement. Denies vaginal bleeding.  She received her prenatal care at [facility] from [facility].  Support person in labor: spouse  Ultrasounds . Anatomy U/S: ***  Prenatal History/Complications: . None***  OB BOX:  Nursing Staff Provider  Office Location [facility] Dating  LMP  Language  Spanish Anatomy US  Normal  Flu Vaccine  Declined 05/01/20 Genetic Screen  NIPS:  AFP: declined  NT [place] normal   TDaP Vaccine   Declined Hgb A1C or  GTT Early  Third trimester   COVID Vaccine No   LAB RESULTS   Rhogam  NA Blood Type O/Positive/-- (09/30 0000)   Feeding Plan Breast Antibody Negative (09/30 0000)  Contraception Vasectomy Rubella Immune (09/30 0000)  Circumcision Undecided RPR Non Reactive (02/04 0932)   P[facility]ian  [facility] HBsAg     Support Person Husband HCVAb   Prenatal Classes  HIV Non Reactive (02/04 0932)     BTL Consent NA GBS   (For PCN allergy, check sensitivities)   VBAC Consent NA Pap     Hgb Electro    BP Cuff In-person CF     SMA     Waterbirth  [ ]  Class [ ]  Consent [ ]  CNM visit    Induction  [ ]  Orders Entered [ ] Foley Y/N   Past Medical History: Past Medical History:  Diagnosis Date  . Abnormal Pap smear   . Active labor at term 04/14/2012  . History of UTI   . Murmur   . Postpartum care following vaginal delivery 06/15/2014  . Vaginal delivery 04/14/2012  . Vaginal Pap smear, abnormal     Past Surgical History: Past Surgical History:  Procedure Laterality Date  . NO PAST SURGERIES      Obstetrical History: OB History    Gravida  5   Para  4   Term  4   Preterm      AB      Living  4     SAB      IAB      Ectopic      Multiple  0   Live Births  4           Social History: Social History   Socioeconomic History  . Marital status: Married    Spouse name: Not on  file  . Number of children: Not on file  . Years of education: Not on file  . Highest education level: Not on file  Occupational History  . Not on file  Tobacco Use  . Smoking status: Never Smoker  . Smokeless tobacco: Never Used  Vaping Use  . Vaping Use: Never used  Substance and Sexual Activity  . Alcohol use: Not Currently  . Drug use: No  . Sexual activity: Yes    Birth control/protection: None  Other Topics Concern  . Not on file  Social History Narrative  . Not on file   Social Determinants of Health   Financial Resource Strain: Not on file  Food Insecurity: No Food Insecurity  . Worried About in the Last Year: Never true  . Ran Out of Food in the Last Year: Never true  Transportation Needs: No Transportation Needs  . Lack of Transportation (Medical): No  . Lack of Transportation (Non-Medical): No  Physical Activity: Not on file  Stress: Not on file  Social Connections: Not on file    Family History: Family History  Problem Relation Age of Onset  . Cancer Neg Hx   . Diabetes Neg Hx   . Heart disease Neg Hx   . Hypertension Neg Hx     Allergies: No Known Allergies  Medications Prior to Admission  Medication Sig Dispense Refill Last Dose  . aspirin EC 81 MG tablet Take 1 tablet (81 mg total) by mouth daily. Swallow whole. 30 tablet 11 09/24/2020 at Unknown time  . ferrous sulfate 325 (65 FE) MG tablet Take 1 tablet (325 mg total) by mouth every other day. 30 tablet 3 09/24/2020 at Unknown time  . Prenatal Vit w/Fe-Methylfol-FA (PNV PO) Take by mouth.   09/24/2020 at Unknown time     Review of Systems  All systems reviewed and negative except as stated in HPI  Blood pressure 122/73, pulse 78, temperature 97.9 F (36.6 C), temperature source Oral, resp. rate 18, height 5\' 2"  (1.575 m), weight 73.2 kg, last menstrual period 12/24/2019. General appearance: {general exam:16600} Lungs: no respiratory distress Heart: regular rate  Abdomen:  soft, non-tender; gravid b Pelvic: *** Extremities: Homans sign is negative, no sign of DVT Presentation: {desc; fetal presentation:14558} Fetal monitoring: *** Uterine activity: *** Dilation: Lip/rim  Prenatal labs: ABO, Rh: O/Positive/-- (09/30 0000) Antibody: Negative (09/30 0000) Rubella: Immune (09/30 0000) RPR: Non Reactive (02/04 0932)  HBsAg:    HIV: Non Reactive (02/04 0932)  GBS:    Glucola: *** Genetic screening:  ***  Prenatal Transfer Tool  Maternal Diabetes: {Maternal Diabetes:3043596} Genetic Screening: {Genetic Screening:20205} Maternal Ultrasounds/Referrals: {Maternal Ultrasounds / Referrals:20211} Fetal Ultrasounds or other Referrals:  {Fetal Ultrasounds or Other Referrals:20213} Maternal Substance Abuse:  {Maternal Substance Abuse:20223} Significant Maternal Medications:  {Significant Maternal Meds:20233} Significant Maternal Lab Results: {Significant Maternal Lab Results:20235}  No results found for this or any previous visit (from the past 24 hour(s)).  Patient Active Problem List   Diagnosis Date Noted  . Language barrier 07/28/2020  . Marginal insertion of umbilical cord affecting management of mother 05/29/2020  . AMA (advanced maternal age) multigravida 35+ 05/29/2020  . Supervision of high risk pregnancy, antepartum 04/10/2020  . Early GDM diagnosis 04/04/2020    Assessment/Plan:  Destiny Cooper is a 40 y.o. G5P4004 at [redacted]w[redacted]d here for***  Labor: *** -- pain control: ***  Fetal Wellbeing: EFW *** by Leopold's. Cephalic by ***.  -- GBS (***) -- continuous fetal monitoring - ***   Postpartum Planning -- breast/*** -- R***/[***]Tdap    [redacted]w[redacted]d, CNM  09/24/2020, 11:52 PM

## 2020-09-25 ENCOUNTER — Encounter (HOSPITAL_COMMUNITY): Payer: Self-pay | Admitting: Obstetrics and Gynecology

## 2020-09-25 DIAGNOSIS — O24430 Gestational diabetes mellitus in the puerperium, diet controlled: Secondary | ICD-10-CM

## 2020-09-25 LAB — RPR: RPR Ser Ql: NONREACTIVE

## 2020-09-25 LAB — CBC
HCT: 39.2 % (ref 36.0–46.0)
Hemoglobin: 12.1 g/dL (ref 12.0–15.0)
MCH: 29.6 pg (ref 26.0–34.0)
MCHC: 30.9 g/dL (ref 30.0–36.0)
MCV: 95.8 fL (ref 80.0–100.0)
Platelets: 195 10*3/uL (ref 150–400)
RBC: 4.09 MIL/uL (ref 3.87–5.11)
RDW: 13.9 % (ref 11.5–15.5)
WBC: 6.4 10*3/uL (ref 4.0–10.5)
nRBC: 0 % (ref 0.0–0.2)

## 2020-09-25 LAB — TYPE AND SCREEN
ABO/RH(D): O POS
Antibody Screen: NEGATIVE

## 2020-09-25 LAB — RESP PANEL BY RT-PCR (FLU A&B, COVID) ARPGX2
Influenza A by PCR: NEGATIVE
Influenza B by PCR: NEGATIVE
SARS Coronavirus 2 by RT PCR: NEGATIVE

## 2020-09-25 LAB — GLUCOSE, CAPILLARY: Glucose-Capillary: 71 mg/dL (ref 70–99)

## 2020-09-25 MED ORDER — LIDOCAINE HCL (PF) 1 % IJ SOLN
30.0000 mL | INTRAMUSCULAR | Status: DC | PRN
  Filled 2020-09-25: qty 30

## 2020-09-25 MED ORDER — ACETAMINOPHEN 325 MG PO TABS: 650.0000 mg | ORAL_TABLET | ORAL | Status: DC | PRN

## 2020-09-25 MED ORDER — COCONUT OIL OIL: 1.0000 "application " | TOPICAL_OIL | Status: DC | PRN

## 2020-09-25 MED ORDER — ONDANSETRON HCL 4 MG PO TABS: 4.0000 mg | ORAL_TABLET | ORAL | Status: DC | PRN

## 2020-09-25 MED ORDER — ONDANSETRON HCL 4 MG/2ML IJ SOLN: 4.0000 mg | Freq: Four times a day (QID) | INTRAMUSCULAR | Status: DC | PRN

## 2020-09-25 MED ORDER — PRENATAL MULTIVITAMIN CH
1.0000 | ORAL_TABLET | Freq: Every day | ORAL | Status: DC
  Administered 2020-09-25: 1 via ORAL
  Filled 2020-09-25 (×2): qty 1

## 2020-09-25 MED ORDER — FENTANYL CITRATE (PF) 100 MCG/2ML IJ SOLN: 50.0000 ug | INTRAMUSCULAR | Status: DC | PRN

## 2020-09-25 MED ORDER — SOD CITRATE-CITRIC ACID 500-334 MG/5ML PO SOLN: 30.0000 mL | ORAL | Status: DC | PRN

## 2020-09-25 MED ORDER — WITCH HAZEL-GLYCERIN EX PADS: 1.0000 "application " | MEDICATED_PAD | CUTANEOUS | Status: DC | PRN

## 2020-09-25 MED ORDER — ONDANSETRON HCL 4 MG/2ML IJ SOLN: 4.0000 mg | INTRAMUSCULAR | Status: DC | PRN

## 2020-09-25 MED ORDER — IBUPROFEN 600 MG PO TABS
600.0000 mg | ORAL_TABLET | Freq: Four times a day (QID) | ORAL | Status: DC
  Administered 2020-09-25 (×3): 600 mg via ORAL
  Filled 2020-09-25 (×6): qty 1

## 2020-09-25 MED ORDER — LACTATED RINGERS IV SOLN: 500.0000 mL | INTRAVENOUS | Status: DC | PRN

## 2020-09-25 MED ORDER — OXYTOCIN BOLUS FROM INFUSION: 333.0000 mL | Freq: Once | INTRAVENOUS | Status: DC

## 2020-09-25 MED ORDER — OXYTOCIN-SODIUM CHLORIDE 30-0.9 UT/500ML-% IV SOLN: 2.5000 [IU]/h | INTRAVENOUS | Status: DC

## 2020-09-25 MED ORDER — SENNOSIDES-DOCUSATE SODIUM 8.6-50 MG PO TABS
2.0000 | ORAL_TABLET | Freq: Every day | ORAL | Status: DC
  Filled 2020-09-25: qty 2

## 2020-09-25 MED ORDER — DIPHENHYDRAMINE HCL 25 MG PO CAPS: 25.0000 mg | ORAL_CAPSULE | Freq: Four times a day (QID) | ORAL | Status: DC | PRN

## 2020-09-25 MED ORDER — ZOLPIDEM TARTRATE 5 MG PO TABS: 5.0000 mg | ORAL_TABLET | Freq: Every evening | ORAL | Status: DC | PRN

## 2020-09-25 MED ORDER — DIBUCAINE (PERIANAL) 1 % EX OINT: 1.0000 "application " | TOPICAL_OINTMENT | CUTANEOUS | Status: DC | PRN

## 2020-09-25 MED ORDER — SIMETHICONE 80 MG PO CHEW: 80.0000 mg | CHEWABLE_TABLET | ORAL | Status: DC | PRN

## 2020-09-25 MED ORDER — BENZOCAINE-MENTHOL 20-0.5 % EX AERO: 1.0000 "application " | INHALATION_SPRAY | CUTANEOUS | Status: DC | PRN

## 2020-09-25 MED ORDER — TETANUS-DIPHTH-ACELL PERTUSSIS 5-2.5-18.5 LF-MCG/0.5 IM SUSY: 0.5000 mL | PREFILLED_SYRINGE | Freq: Once | INTRAMUSCULAR | Status: DC

## 2020-09-25 NOTE — Social Work (Signed)
CSW received consult for late and limited PNC.    CSW reviewed chart and is screening out consult as it does not meet criteria for automatic CSW involvement and infant drug screening.  MOB started care prior to 28 weeks and had more than 3 visits.  MOB had 5 visits and initiated care at [redacted]w[redacted]d. Please contact CSW if current concerns arise or by MOB's request.  Please contact the Clinical Social Worker if needs arise, by MOB request, or if MOB scores greater than 9/yes to question 10 on Edinburgh Postpartum Depression Screen.  Bexleigh Theriault, LCSWA Clinical Social Work [facility]  [phone] 

## 2020-09-25 NOTE — Discharge Summary (Signed)
Postpartum Discharge Summary    Patient Name: Destiny Cooper DOB: 1981-01-12 MRN: [id number]  Date of admission: 09/24/2020 Delivery date:09/24/2020  Delivering provider: Laury Deep  Date of discharge: 09/26/2020  Admitting diagnosis: Normal labor [O80, Z37.9] Intrauterine pregnancy: [redacted]w[redacted]d    Secondary diagnosis:  Active Problems:   Vaginal delivery   Early GDM diagnosis   Marginal insertion of umbilical cord affecting management of mother   AMA (advanced maternal age) multigravida 342+  Language barrier   Normal labor  Additional problems: none    Discharge diagnosis: Term Pregnancy Delivered and GDM A1                                              Post partum procedures:none Augmentation: N/A Complications: None  Hospital course: Onset of Labor With Vaginal Delivery      40y.o. yo G5P5005 at 39w2das admitted in Active Labor on 09/24/2020. Patient had an uncomplicated labor course as follows:  Membrane Rupture Time/Date: 11:21 PM ,09/24/2020   Delivery Method:Vaginal, Spontaneous  Episiotomy: None  Lacerations:  None  Patient had an uncomplicated postpartum course.  She is ambulating, tolerating a regular diet, passing flatus, and urinating well. Patient is discharged home in stable condition on 09/26/20.  Newborn Data: Birth date:09/24/2020  Birth time:11:22 PM  Gender:Female  Living status:Living  Apgars:9 ,9  Weight:3350 g   Magnesium Sulfate received: No BMZ received: No Rhophylac:N/A MMR:N/A T-DaP:declined prenatally Flu: No declined prenatally Transfusion:No  Physical exam  Vitals:   09/25/20 1100 09/25/20 2100 09/26/20 0542 09/26/20 0753  BP: 111/64 (!) 100/59 (!) 84/55 101/63  Pulse: 70 75 71 72  Resp: _0 Temp: 98 F (36.7 C) 98.8 F (37.1 C) (!) 97.5 F (36.4 C)   TempSrc: Oral Oral Oral   SpO2:  99% 99%   Weight:      Height:       General: alert, cooperative and no distress Lochia: appropriate Uterine Fundus: firm Incision:  N/A DVT Evaluation: No evidence of DVT seen on physical exam. No cords or calf tenderness. No significant calf/ankle edema. Labs: Lab Results  Component Value Date   WBC 6.4 09/24/2020   HGB 12.1 09/24/2020   HCT 39.2 09/24/2020   MCV 95.8 09/24/2020   PLT 195 09/24/2020   CMP Latest Ref Rng & Units 03/27/2020  Glucose 70 - 99 77  BUN 6 - 20 mg/dL -  Creatinine 0.57 - 1.00 mg/dL -  Sodium 134 - 144 mmol/L -  Potassium 3.5 - 5.2 mmol/L -  Chloride 96 - 106 mmol/L -  CO2 18 - 29 mmol/L -  Calcium 8.7 - 10.2 mg/dL -  Total Protein 6.0 - 8.5 g/dL -  Total Bilirubin 0.0 - 1.2 mg/dL -  Alkaline Phos 39 - 117 IU/L -  AST 0 - 40 IU/L -  ALT 0 - 32 IU/L -   Edinburgh Score: No flowsheet data found.   After visit meds:  Allergies as of 09/26/2020   No Known Allergies     Medication List    STOP taking these medications   aspirin EC 81 MG tablet   ferrous sulfate 325 (65 FE) MG tablet     TAKE these medications   acetaminophen 325 MG tablet Commonly known as: Tylenol Take 2 tablets (650 mg total) by mouth every 6 (six)  hours as needed for mild pain, moderate pain, fever or headache (for pain scale < 4).   coconut oil Oil Apply 1 application topically as needed (nipple pain).   ibuprofen 600 MG tablet Commonly known as: ADVIL Take 1 tablet (600 mg total) by mouth every 6 (six) hours.   PNV PO Take by mouth.        Discharge home in stable condition Infant Feeding: Breast Infant Disposition:home with mother Discharge instruction: per After Visit Summary and Postpartum booklet. Activity: Advance as tolerated. Pelvic rest for 6 weeks.  Diet: routine diet Future Appointments: Future Appointments  Date Time Provider [place]  10/25/2020  8:20 AM [facility]  10/25/2020  9:15 AM Chancy Milroy, MD [facility]   Follow up Visit:  [facility]. Go on 10/25/2020.    Specialty: Obstetrics and Gynecology Why: for postpartum checkup and 2 hour sugar test Contact information: [address] [place] [address] [phone]               Please schedule this patient for a In person postpartum visit in 4 weeks with the following provider: MD. Additional Postpartum F/U:2 hour GTT  High risk pregnancy complicated by: GDM Delivery mode:  Vaginal, Spontaneous  Anticipated Birth Control:  Derrek Gu, MD OB Fellow, [facility] 09/26/2020 9:15 AM

## 2020-09-25 NOTE — Progress Notes (Signed)
POSTPARTUM PROGRESS NOTE  Subjective: Destiny Cooper is a 40 y.o. [id number] s/p SVD at [redacted]w[redacted]d.  She reports she doing well. No acute events overnight. She denies any problems with ambulating, voiding or po intake. Denies nausea or vomiting. She has not passed flatus. Pain is well controlled.  Lochia is mild.  Objective: Blood pressure 100/65, pulse 74, temperature 97.8 F (36.6 C), temperature source Oral, resp. rate 18, height 5\' 2"  (1.575 m), weight 73.2 kg, last menstrual period 12/24/2019, SpO2 99 %, unknown if currently breastfeeding.  Physical Exam:  General: alert, cooperative and no distress Chest: no respiratory distress Abdomen: soft, appropriately tender Uterine Fundus: firm and at level of umbilicus Extremities: No calf swelling or tenderness  no edema  Recent Labs    09/24/20 2356  HGB 12.1  HCT 39.2    Assessment/Plan: Destiny Cooper is a 40 y.o. 20 s/p SVD at [redacted]w[redacted]d. PPD#1  Routine Postpartum Care: Doing well, pain well-controlled.  -- Continue routine care, lactation support  -- Contraception: undecided - discussed at bedside -- Feeding: breast -- A1GDM: with sporadic testing at home, A1C 5.3 on 2/4. AM CBG not yet drawn.  -- Limited PNC: SW consulted  Dispo: Plan for discharge PPD#2 .  [redacted]w[redacted]d, MD OB Fellow, [facility] 09/25/2020 7:13 AM

## 2020-09-26 MED ORDER — IBUPROFEN 600 MG PO TABS: 600.0000 mg | ORAL_TABLET | Freq: Four times a day (QID) | ORAL | 0 refills | Status: DC

## 2020-09-26 MED ORDER — ACETAMINOPHEN 325 MG PO TABS: 650.0000 mg | ORAL_TABLET | Freq: Four times a day (QID) | ORAL | Status: DC | PRN

## 2020-09-26 MED ORDER — COCONUT OIL OIL: 1.0000 "application " | TOPICAL_OIL | 0 refills | Status: DC | PRN

## 2020-10-16 ENCOUNTER — Encounter (HOSPITAL_COMMUNITY): Payer: Self-pay | Admitting: Emergency Medicine

## 2020-10-16 ENCOUNTER — Emergency Department (HOSPITAL_COMMUNITY)
Admission: EM | Admit: 2020-10-16 | Discharge: 2020-10-17 | Disposition: A | Discharge status: Home / Self Care | Payer: Self-pay | Attending: Emergency Medicine | Admitting: Emergency Medicine

## 2020-10-16 ENCOUNTER — Ambulatory Visit (HOSPITAL_COMMUNITY)
Admission: EM | Admit: 2020-10-16 | Discharge: 2020-10-16 | Disposition: A | Discharge status: Home / Self Care | Payer: Self-pay | Attending: Urgent Care | Admitting: Urgent Care

## 2020-10-16 ENCOUNTER — Other Ambulatory Visit: Payer: Self-pay

## 2020-10-16 ENCOUNTER — Encounter (HOSPITAL_COMMUNITY): Payer: Self-pay

## 2020-10-16 DIAGNOSIS — N3000 Acute cystitis without hematuria: Secondary | ICD-10-CM | POA: Diagnosis not present

## 2020-10-16 DIAGNOSIS — Z20822 Contact with and (suspected) exposure to covid-19: Secondary | ICD-10-CM | POA: Insufficient documentation

## 2020-10-16 DIAGNOSIS — R519 Headache, unspecified: Secondary | ICD-10-CM | POA: Diagnosis not present

## 2020-10-16 DIAGNOSIS — J111 Influenza due to unidentified influenza virus with other respiratory manifestations: Secondary | ICD-10-CM

## 2020-10-16 DIAGNOSIS — R509 Fever, unspecified: Secondary | ICD-10-CM | POA: Diagnosis present

## 2020-10-16 DIAGNOSIS — B9689 Other specified bacterial agents as the cause of diseases classified elsewhere: Secondary | ICD-10-CM | POA: Insufficient documentation

## 2020-10-16 DIAGNOSIS — R52 Pain, unspecified: Secondary | ICD-10-CM

## 2020-10-16 DIAGNOSIS — N61 Mastitis without abscess: Secondary | ICD-10-CM | POA: Insufficient documentation

## 2020-10-16 LAB — CBC WITH DIFFERENTIAL/PLATELET
Abs Immature Granulocytes: 0 10*3/uL (ref 0.00–0.07)
Basophils Absolute: 0 10*3/uL (ref 0.0–0.1)
Basophils Relative: 0 %
Eosinophils Absolute: 0 10*3/uL (ref 0.0–0.5)
Eosinophils Relative: 0 %
HCT: 40.5 % (ref 36.0–46.0)
Hemoglobin: 13.2 g/dL (ref 12.0–15.0)
Lymphocytes Relative: 19 %
Lymphs Abs: 1.5 10*3/uL (ref 0.7–4.0)
MCH: 28.4 pg (ref 26.0–34.0)
MCHC: 32.6 g/dL (ref 30.0–36.0)
MCV: 87.1 fL (ref 80.0–100.0)
Monocytes Absolute: 0.3 10*3/uL (ref 0.1–1.0)
Monocytes Relative: 4 %
Neutro Abs: 6.1 10*3/uL (ref 1.7–7.7)
Neutrophils Relative %: 77 %
Platelets: 187 10*3/uL (ref 150–400)
RBC: 4.65 MIL/uL (ref 3.87–5.11)
RDW: 13.6 % (ref 11.5–15.5)
WBC: 7.9 10*3/uL (ref 4.0–10.5)
nRBC: 0 % (ref 0.0–0.2)
nRBC: 1 /100 WBC — ABNORMAL HIGH

## 2020-10-16 LAB — BASIC METABOLIC PANEL
Anion gap: 9 (ref 5–15)
BUN: 9 mg/dL (ref 6–20)
CO2: 24 mmol/L (ref 22–32)
Calcium: 9.1 mg/dL (ref 8.9–10.3)
Chloride: 100 mmol/L (ref 98–111)
Creatinine, Ser: 0.6 mg/dL (ref 0.44–1.00)
GFR, Estimated: >60 mL/min (ref >60)
Glucose, Bld: 124 mg/dL — ABNORMAL HIGH (ref 70–99)
Potassium: 4 mmol/L (ref 3.5–5.1)
Sodium: 133 mmol/L — ABNORMAL LOW (ref 135–145)

## 2020-10-16 LAB — POC INFLUENZA A AND B ANTIGEN (URGENT CARE ONLY)
INFLUENZA A ANTIGEN, POC: NEGATIVE
INFLUENZA B ANTIGEN, POC: NEGATIVE

## 2020-10-16 MED ORDER — ACETAMINOPHEN 325 MG PO TABS
650.0000 mg | ORAL_TABLET | Freq: Once | ORAL | Status: AC
  Administered 2020-10-16: 650 mg via ORAL

## 2020-10-16 MED ORDER — ACETAMINOPHEN 325 MG PO TABS
ORAL_TABLET | ORAL | Status: AC
  Filled 2020-10-16: qty 2

## 2020-10-16 MED ORDER — OSELTAMIVIR PHOSPHATE 75 MG PO CAPS: 75.0000 mg | ORAL_CAPSULE | Freq: Two times a day (BID) | ORAL | 0 refills | Status: DC

## 2020-10-16 MED ORDER — ACETAMINOPHEN 325 MG PO TABS
650.0000 mg | ORAL_TABLET | Freq: Once | ORAL | Status: AC
  Administered 2020-10-16: 650 mg via ORAL
  Filled 2020-10-16: qty 2

## 2020-10-16 NOTE — ED Provider Notes (Signed)
[facility]   MRN: [id number] DOB: September 23, 1980  Subjective:   Destiny Cooper is a 40 y.o. female presenting for 2 to 3-day history of acute onset malaise, fatigue, body aches, frontal headache, fever, chills, weakness.  Patient states that her breasts were sore yesterday, is breast-feeding.  Its improved significantly today.  Denies runny or stuffy nose, sore throat, cough, chest pain, shortness of breath, nausea, vomiting, abdominal pain, nipple discharge, rashes.  Denies history of respiratory disorders.  She is not a smoker.  Does not want a COVID test, states that she had a negative one at home.  No current facility-administered medications for this encounter.  Current Outpatient Medications:  .  acetaminophen (TYLENOL) 325 MG tablet, Take 2 tablets (650 mg total) by mouth every 6 (six) hours as needed for mild pain, moderate pain, fever or headache (for pain scale < 4)., Disp: , Rfl:  .  coconut oil OIL, Apply 1 application topically as needed (nipple pain)., Disp: , Rfl: 0 .  ibuprofen (ADVIL) 600 MG tablet, Take 1 tablet (600 mg total) by mouth every 6 (six) hours., Disp: 30 tablet, Rfl: 0 .  Prenatal Vit w/Fe-Methylfol-FA (PNV PO), Take by mouth., Disp: , Rfl:    No Known Allergies  Past Medical History:  Diagnosis Date  . Abnormal Pap smear   . Active labor at term 04/14/2012  . History of UTI   . Murmur   . Postpartum care following vaginal delivery 06/15/2014  . Vaginal delivery 04/14/2012  . Vaginal Pap smear, abnormal      Past Surgical History:  Procedure Laterality Date  . NO PAST SURGERIES      Family History  Problem Relation Age of Onset  . Cancer Neg Hx   . Diabetes Neg Hx   . Heart disease Neg Hx   . Hypertension Neg Hx     Social History   Tobacco Use  . Smoking status: Never Smoker  . Smokeless tobacco: Never Used  Vaping Use  . Vaping Use: Never used  Substance Use Topics  . Alcohol use: Not Currently  . Drug use: No     ROS   Objective:   Vitals: BP 123/78 (BP Location: Right Arm)   Pulse (!) 128   Temp (!) 103.1 F (39.5 C) (Oral)   Resp 18   LMP 12/24/2019 (Approximate)   SpO2 98%   Breastfeeding Yes   Pulse was 91, temp 99.75F on recheck by PA-Hendrixx Severin.   Physical Exam Constitutional:      General: She is not in acute distress.    Appearance: Normal appearance. She is well-developed. She is not ill-appearing, toxic-appearing or diaphoretic.  HENT:     Head: Normocephalic and atraumatic.     Right Ear: Tympanic membrane, ear canal and external ear normal. No drainage or tenderness. No middle ear effusion. Tympanic membrane is not erythematous.     Left Ear: Tympanic membrane, ear canal and external ear normal. No drainage or tenderness.  No middle ear effusion. Tympanic membrane is not erythematous.     Nose: Nose normal. No congestion or rhinorrhea.     Mouth/Throat:     Mouth: Mucous membranes are moist. No oral lesions.     Pharynx: Oropharynx is clear. No pharyngeal swelling, oropharyngeal exudate, posterior oropharyngeal erythema or uvula swelling.     Tonsils: No tonsillar exudate or tonsillar abscesses.  Eyes:     General: No scleral icterus.       Right eye: No  discharge.        Left eye: No discharge.     Extraocular Movements: Extraocular movements intact.     Right eye: Normal extraocular motion.     Left eye: Normal extraocular motion.     Conjunctiva/sclera: Conjunctivae normal.     Pupils: Pupils are equal, round, and reactive to light.  Neck:     Meningeal: Brudzinski's sign and Kernig's sign absent.  Cardiovascular:     Rate and Rhythm: Normal rate.  Pulmonary:     Effort: Pulmonary effort is normal.  Musculoskeletal:     Cervical back: Normal range of motion and neck supple. No rigidity.  Lymphadenopathy:     Cervical: No cervical adenopathy.  Skin:    General: Skin is warm and dry.  Neurological:     General: No focal deficit present.     Mental Status: She  is alert and oriented to person, place, and time.     Cranial Nerves: No cranial nerve deficit.     Motor: No weakness.     Coordination: Coordination normal.     Gait: Gait normal.     Deep Tendon Reflexes: Reflexes normal.  Psychiatric:        Mood and Affect: Mood normal. Mood is not anxious or depressed.        Speech: Speech normal.        Behavior: Behavior normal. Behavior is not agitated.        Thought Content: Thought content normal.        Judgment: Judgment normal.     Results for orders placed or performed during the hospital encounter of 10/16/20 (from the past 24 hour(s))  POC Influenza A & B Ag (Urgent Care)     Status: None   Collection Time: 10/16/20  4:00 PM  Result Value Ref Range   INFLUENZA A ANTIGEN, POC NEGATIVE NEGATIVE   INFLUENZA B ANTIGEN, POC NEGATIVE NEGATIVE    Assessment and Plan :   PDMP not reviewed this encounter.  1. Influenza-like illness   2. Fever, unspecified   3. Body aches     We will manage patient for clinical diagnosis of influenza despite negative testing.  Recommended starting Tamiflu, using supportive care safe while breast-feeding. Counseled patient on potential for adverse effects with medications prescribed/recommended today, ER and return-to-clinic precautions discussed, patient verbalized understanding.    Wallis Bamberg, [place] 10/16/20 1617

## 2020-10-16 NOTE — ED Triage Notes (Signed)
Emergency Medicine Provider Triage Evaluation Note  Destiny Cooper , a 40 y.o. female  was evaluated in triage.  Pt complains of headache, malaise, fatigue, myalgias, fever, chills, and generalized weakness. She was evaluated at UC earlier today and diagnosed with a possible flu-like illness and was prescribed Tamiflu; however her influenza test was negative. She reports to the ED due to persistent headache. She had breast tenderness over the weekend that improved. She denies redness to breast. She is currently breastfeeding and had a SVD on 4/3.   Review of Systems  Positive: Fever, headache Negative: Abdominal pain  Physical Exam  BP 121/74 (BP Location: Right Arm)   Pulse (!) 130   Temp (!) 103.1 F (39.5 C) (Oral)   Resp 18   LMP 12/24/2019 (Approximate)   SpO2 99%  Gen:   Awake, no distress   HEENT:  Atraumatic  Resp:  Normal effort Cardiac:  Tachycardic Abd:   Nondistended, nontender  MSK:   Moves extremities without difficulty  Neuro:  Speech clear   Medical Decision Making  Medically screening exam initiated at 9:21 PM.  Appropriate orders placed.  Jenan Ellegood was informed that the remainder of the evaluation will be completed by another provider, this initial triage assessment does not replace that evaluation, and the importance of remaining in the ED until their evaluation is complete.  Clinical Impression  Flu like symptoms. COVID/Influenza test ordered. Consider mastitis given systemic symptoms and history of breast pain; however, unable to evaluate at triage. Labs ordered.   Mannie Stabile, [place] 10/16/20 2126

## 2020-10-16 NOTE — ED Triage Notes (Signed)
Pt reports fever/chills since Sat, seen at [facility] today. Pt reports breast tenderness. Pt is breastfeeding mother. Denies abdominal pain.

## 2020-10-16 NOTE — ED Triage Notes (Addendum)
Pt reports fever, chills, headache, weakness lightheaded x 3 days. Tylenol gives relief, last dose yesterday.   Pt recently pregnant 09/24/2020. Pt breastfeeding.

## 2020-10-16 NOTE — Discharge Instructions (Addendum)
Para el dolor de garganta o tos puede usar un t de miel. Use 3 cucharaditas de miel con jugo exprimido de [employer]. Coloque trozos de Microbiologist en 1/2-1 taza de agua y caliente sobre la estufa. Luego mezcle los ingredientes y repita cada 4 horas. Para fiebre, dolores de cuerpo tome ibuprofeno 400mg -600mg  con comida cada 6 horas alternando con o junto con Tylenol 500mg -650mg  cada 6 horas. Hidrata muy bien con al menos 2 litros (64 onzas) de agua al dia. Coma comidas ligeras como sopas para y nutricion. Tambien puede tomar suero.

## 2020-10-17 LAB — RESP PANEL BY RT-PCR (FLU A&B, COVID) ARPGX2
Influenza A by PCR: NEGATIVE
Influenza B by PCR: NEGATIVE
SARS Coronavirus 2 by RT PCR: NEGATIVE

## 2020-10-17 LAB — URINALYSIS, ROUTINE W REFLEX MICROSCOPIC
Bilirubin Urine: NEGATIVE
Glucose, UA: NEGATIVE mg/dL
Ketones, ur: NEGATIVE mg/dL
Nitrite: NEGATIVE
Protein, ur: 100 mg/dL — AB
Specific Gravity, Urine: 1.023 (ref 1.005–1.030)
pH: 6 (ref 5.0–8.0)

## 2020-10-17 MED ORDER — CEPHALEXIN 250 MG PO CAPS
500.0000 mg | ORAL_CAPSULE | Freq: Once | ORAL | Status: AC
  Administered 2020-10-17: 500 mg via ORAL
  Filled 2020-10-17: qty 2

## 2020-10-17 MED ORDER — CEPHALEXIN 500 MG PO CAPS: 500.0000 mg | ORAL_CAPSULE | Freq: Four times a day (QID) | ORAL | 0 refills | Status: DC

## 2020-10-17 NOTE — ED Provider Notes (Signed)
[facility] EMERGENCY DEPARTMENT Provider Note   CSN: [id number] Arrival date & time: 10/16/20  2037     History Chief Complaint - fever and headache  Destiny Cooper is a 40 y.o. female.  The history is provided by the patient.  Fever Severity:  Moderate Onset quality:  Gradual Duration:  2 days Timing:  Constant Progression:  Worsening Chronicity:  New Relieved by:  Nothing Worsened by:  Nothing Associated symptoms: chills, headaches and myalgias   Associated symptoms: no chest pain, no cough, no diarrhea, no dysuria, no nausea, no rash, no sore throat and no vomiting   Risk factors: no recent travel   Patient presents with fever and headache.  This began approximately 2-3 days ago.  She reports chills, fever, headache.  No vomiting or diarrhea.  No cough or shortness of breath. Patient underwent a normal spontaneous vaginal delivery on April 3 without any complications.  She has been breast-feeding without difficulty but does report breast soreness. Denies any unusual vaginal discharge.  No abdominal pain.  No dysuria.  She reports only mild vaginal bleeding that is typical for her after delivery     Past Medical History:  Diagnosis Date  . Abnormal Pap smear   . Active labor at term 04/14/2012  . History of UTI   . Murmur   . Postpartum care following vaginal delivery 06/15/2014  . Vaginal delivery 04/14/2012  . Vaginal Pap smear, abnormal     Patient Active Problem List   Diagnosis Date Noted  . Normal labor 09/24/2020  . Language barrier 07/28/2020  . Marginal insertion of umbilical cord affecting management of mother 05/29/2020  . AMA (advanced maternal age) multigravida 35+ 05/29/2020  . Supervision of high risk pregnancy, antepartum 04/10/2020  . 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 Vaginal delivery 04/14/2012    Past Surgical History:  Procedure Laterality Date  . NO PAST SURGERIES       OB History    Gravida  5   Para  5    Term  5   Preterm      AB      Living  5     SAB      IAB      Ectopic      Multiple  0   Live Births  5           Family History  Problem Relation Age of Onset  . Cancer Neg Hx   . Diabetes Neg Hx   . Heart disease Neg Hx   . Hypertension Neg Hx     Social History   Tobacco Use  . Smoking status: Never Smoker  . Smokeless tobacco: Never Used  Vaping Use  . Vaping Use: Never used  Substance Use Topics  . Alcohol use: Not Currently  . Drug use: No    Home Medications Prior to Admission medications   Medication Sig Start Date End Date Taking? Authorizing Provider  cephALEXin (KEFLEX) 500 MG capsule Take 1 capsule (500 mg total) by mouth 4 (four) times daily. 10/17/20  Yes Zadie Rhine, MD  acetaminophen (TYLENOL) 325 MG tablet Take 2 tablets (650 mg total) by mouth every 6 (six) hours as needed for mild pain, moderate pain, fever or headache (for pain scale < 4). 09/26/20   Sheila Oats, MD  coconut oil OIL Apply 1 application topically as needed (nipple pain). 09/26/20   Sheila Oats, MD  ibuprofen (ADVIL) 600 MG tablet Take 1 tablet (  600 mg total) by mouth every 6 (six) hours. 09/26/20   Sheila Oats, MD  Prenatal Vit w/Fe-Methylfol-FA (PNV PO) Take by mouth.    [provider]    Allergies    Patient has no known allergies.  Review of Systems   Review of Systems  Constitutional: Positive for chills and fever.  HENT: Negative for sore throat.   Respiratory: Negative for cough.   Cardiovascular: Negative for chest pain.  Gastrointestinal: Negative for diarrhea, nausea and vomiting.  Genitourinary: Negative for dysuria and vaginal discharge.  Musculoskeletal: Positive for myalgias. Negative for back pain.  Skin: Negative for rash.  Neurological: Positive for headaches.  All other systems reviewed and are negative.   Physical Exam Updated Vital Signs BP 125/79   Pulse 88   Temp 98.2 F (36.8 C) (Oral)   Resp (!) 25   LMP  12/24/2019 (Approximate)   SpO2 97%   Physical Exam CONSTITUTIONAL: Well developed/well nourished HEAD: Normocephalic/atraumatic EYES: EOMI/PERRL ENMT: Mucous membranes moist, uvula midline without erythema or exudates NECK: supple no meningeal signs SPINE/BACK:entire spine nontender CV: S1/S2 noted, no murmurs/rubs/gallops noted LUNGS: Lungs are clear to auscultation bilaterally, no apparent distress Breast-right breast is engorged with significant erythema and tenderness.  No abscess.  Small amount of milky discharge is noted.  The left breast is not erythematous or significantly tender Nurse Victorino Dike present for entire breast exam ABDOMEN: soft, nontender, no rebound or guarding, bowel sounds noted throughout abdomen GU:no cva tenderness NEURO: Pt is awake/alert/appropriate, moves all extremitiesx4.  No facial droop.   EXTREMITIES: pulses normal/equal, full ROM SKIN: warm, color normal, no rash to her extremities/hands PSYCH: no abnormalities of mood noted, alert and oriented to situation  ED Results / Procedures / Treatments   Labs (all labs ordered are listed, but only abnormal results are displayed) Labs Reviewed  CBC WITH DIFFERENTIAL/PLATELET - Abnormal; Notable for the following components:      Result Value   nRBC 1 (*)    All other components within normal limits  BASIC METABOLIC PANEL - Abnormal; Notable for the following components:   Sodium 133 (*)    Glucose, Bld 124 (*)    All other components within normal limits  URINALYSIS, ROUTINE W REFLEX MICROSCOPIC - Abnormal; Notable for the following components:   APPearance HAZY (*)    Hgb urine dipstick LARGE (*)    Protein, ur 100 (*)    Leukocytes,Ua MODERATE (*)    Bacteria, UA RARE (*)    All other components within normal limits  RESP PANEL BY RT-PCR (FLU A&B, COVID) ARPGX2    EKG None  Radiology No results found.  Procedures Procedures   Medications Ordered in ED Medications  acetaminophen  (TYLENOL) tablet 650 mg (650 mg Oral Given 10/16/20 2123)  cephALEXin (KEFLEX) capsule 500 mg (500 mg Oral Given 10/17/20 0440)    ED Course  I have reviewed the triage vital signs and the nursing notes.  Pertinent labs results that were available during my care of the patient were reviewed by me and considered in my medical decision making (see chart for details).    MDM Rules/Calculators/A&P                          Patient is over 3 weeks postpartum presenting with fever, body aches and headache.  She denies any respiratory or gastrointestinal symptoms.  COVID and influenza testing are both negative.  On my exam with nurse present,  patient has erythema and tenderness noted to the right breast.  No abscess is identified. I suspect patient has mastitis.  She may also have a UTI given her urinalysis findings. Plan will be to place patient on Keflex 4 times daily for 1 week.  She has OB/GYN postnatal follow-up next week Overall patient is very well-appearing and in no acute distress.  She is not septic appearing.  No signs of meningitis.  Final Clinical Impression(s) / ED Diagnoses Final diagnoses:  Mastitis  Acute cystitis without hematuria    Rx / DC Orders ED Discharge Orders         Ordered    cephALEXin (KEFLEX) 500 MG capsule  4 times daily        10/17/20 0430           Zadie Rhine, MD 10/17/20 [phone]

## 2020-10-17 NOTE — ED Provider Notes (Signed)
Utilized spanish interpreter [place] [phone] at time of discharge   Zadie Rhine, MD 10/17/20 [phone]

## 2020-10-17 NOTE — ED Notes (Signed)
Pt discharged and ambulated out of the ED without difficulty. 

## 2020-10-17 NOTE — ED Notes (Addendum)
Pt is in room

## 2020-10-25 ENCOUNTER — Other Ambulatory Visit: Payer: Self-pay

## 2020-10-25 ENCOUNTER — Ambulatory Visit: Payer: Self-pay | Admitting: Obstetrics and Gynecology

## 2020-10-25 ENCOUNTER — Other Ambulatory Visit: Payer: Self-pay | Admitting: *Deleted

## 2020-10-25 DIAGNOSIS — O24429 Gestational diabetes mellitus in childbirth, unspecified control: Secondary | ICD-10-CM

## 2021-06-29 ENCOUNTER — Other Ambulatory Visit: Payer: Self-pay

## 2021-06-29 ENCOUNTER — Telehealth: Payer: Self-pay

## 2021-06-29 ENCOUNTER — Ambulatory Visit
Admission: EM | Admit: 2021-06-29 | Discharge: 2021-06-29 | Disposition: A | Discharge status: Home / Self Care | Payer: Self-pay | Attending: Physician Assistant | Admitting: Physician Assistant

## 2021-06-29 DIAGNOSIS — J069 Acute upper respiratory infection, unspecified: Secondary | ICD-10-CM

## 2021-06-29 DIAGNOSIS — N61 Mastitis without abscess: Secondary | ICD-10-CM

## 2021-06-29 LAB — POCT INFLUENZA A/B
Influenza A, POC: NEGATIVE
Influenza B, POC: NEGATIVE

## 2021-06-29 MED ORDER — DOXYCYCLINE HYCLATE 100 MG PO CAPS: 100.0000 mg | ORAL_CAPSULE | Freq: Two times a day (BID) | ORAL | 0 refills | Status: DC

## 2021-06-29 NOTE — Telephone Encounter (Signed)
error 

## 2021-06-29 NOTE — ED Provider Notes (Signed)
[name]    CSN: [id number] Arrival date & time: 06/29/21  1221      History   Chief Complaint Chief Complaint  Patient presents with   body chills    HPI Destiny Cooper is a 41 y.o. female.   Patient here today for evaluation of fever, fatigue, body aches, nausea and right breast pain that started yesterday. Patient reports that she has had breast infection in the past and is worried about same. She does not report any treatment for symptoms.    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 Authorizing Provider  doxycycline (VIBRAMYCIN) 100 MG capsule Take 1 capsule (100 mg total) by mouth 2 (two) times daily. 06/29/21  Yes Tomi BambergerMyers, Caliope Ruppert F, PA-C  acetaminophen (TYLENOL) 325 MG tablet Take 2 tablets (650 mg total) by mouth every 6 (six) hours as needed for mild pain, moderate pain, fever or headache (for pain scale < 4). 09/26/20   Sheila OatsGoswick, Anna E, MD  cephALEXin (KEFLEX) 500 MG capsule Take 1 capsule (500 mg total) by mouth 4 (four) times  daily. 10/17/20   Zadie RhineWickline, Donald, MD  coconut oil OIL Apply 1 application topically as needed (nipple pain). 09/26/20   Sheila OatsGoswick, Anna E, MD  ibuprofen (ADVIL) 600 MG tablet Take 1 tablet (600 mg total) by mouth every 6 (six) hours. 09/26/20   Sheila OatsGoswick, Anna E, MD  Prenatal Vit w/Fe-Methylfol-FA (PNV PO) Take by mouth.    [provider]    Family History Family History  Problem Relation Age of Onset   Cancer Neg Hx    Diabetes Neg Hx    Heart disease Neg Hx    Hypertension Neg Hx     Social History Social History   Tobacco Use   Smoking status: Never   Smokeless tobacco: Never  Vaping Use   Vaping Use: Never used  Substance Use Topics   Alcohol use: Not Currently   Drug use: No     Allergies   Patient has no known allergies.   Review of Systems Review of Systems  Constitutional:  Positive for chills and fever.  HENT:  Negative for congestion, ear pain, sinus pressure and sore throat.   Eyes:  Negative for discharge and redness.  Respiratory:  Negative for cough, shortness of breath and wheezing.   Gastrointestinal:  Negative for abdominal pain, diarrhea, nausea and vomiting.  Musculoskeletal:  Positive for myalgias.  Neurological:  Positive for headaches.    Physical Exam Triage Vital Signs ED Triage Vitals [06/29/21 1316]  Enc Vitals Group     BP 95/64     Pulse Rate (!) 107     Resp 18     Temp 99.3 F (37.4 C)     Temp Source Oral     SpO2 97 %     Weight      Height      Head Circumference      Peak Flow      Pain Score 0     Pain Loc      Pain Edu?      Excl. in GC?    No data found.  Updated Vital Signs BP 95/64 (BP Location: Left Arm)    Pulse (!) 107    Temp 99.3 F (37.4 C) (Oral)    Resp 18    SpO2 97%    Breastfeeding Yes      Physical Exam Vitals and nursing note reviewed.  Constitutional:      General: She is not in acute distress.    Appearance: Normal appearance. She is not ill-appearing.  HENT:     Head: Normocephalic  and atraumatic.     Nose: Congestion present.     Mouth/Throat:     Mouth: Mucous membranes are moist.     Pharynx: No oropharyngeal exudate or posterior oropharyngeal erythema.  Eyes:     Conjunctiva/sclera: Conjunctivae normal.  Cardiovascular:     Rate and Rhythm: Normal rate and regular rhythm.     Heart sounds: Normal heart sounds. No murmur heard. Pulmonary:     Effort: Pulmonary effort is normal. No respiratory distress.     Breath sounds: Normal breath sounds. No wheezing, rhonchi or rales.  Skin:    General: Skin is warm and dry.     Comments: Erythematous streak noted to left breast  Neurological:     Mental Status: She is alert.  Psychiatric:        Mood and Affect: Mood normal.        Thought Content: Thought content normal.     UC Treatments / Results  Labs (all labs ordered are listed, but only abnormal results are displayed) Labs Reviewed  COVID-19, FLU A+B NAA  POCT INFLUENZA A/B    EKG   Radiology No results found.  Procedures Procedures (including critical care time)  Medications Ordered in UC Medications - No data to display  Initial Impression / Assessment and Plan / UC Course  I have reviewed the triage vital signs and the nursing notes.  Pertinent labs & imaging results that were available during my care of the patient were reviewed by me and considered in my medical decision making (see chart for details).    Difficult to determine if patient is experiencing viral illness vs breast infection-- will treat to cover infection with doxycycline and will order covid and flu screening Rapid flu test negative. Recommend follow up with any further concerns.   Final Clinical Impressions(s) / UC Diagnoses   Final diagnoses:  Acute upper respiratory infection  Mastitis   Discharge Instructions   None    ED Prescriptions     Medication Sig Dispense Auth. Provider   doxycycline (VIBRAMYCIN) 100 MG capsule  Take 1 capsule (100 mg total) by  mouth 2 (two) times daily. 20 capsule Tomi Bamberger, PA-C      PDMP not reviewed this encounter.   Tomi Bamberger, PA-C 06/29/21 1415

## 2021-06-29 NOTE — ED Triage Notes (Signed)
Pt c/o fever, chills, right breast pain (currently breastfeeding), headache, body aches, nausea,   Denies cough, sore throat, earache, vomiting, diarrhea, constipation  Onset ~ yesterday

## 2021-06-30 LAB — COVID-19, FLU A+B NAA
Influenza A, NAA: NOT DETECTED
Influenza B, NAA: NOT DETECTED
SARS-CoV-2, NAA: NOT DETECTED

## 2022-07-20 ENCOUNTER — Other Ambulatory Visit: Payer: Self-pay

## 2022-07-20 ENCOUNTER — Ambulatory Visit (INDEPENDENT_AMBULATORY_CARE_PROVIDER_SITE_OTHER): Payer: Self-pay

## 2022-07-20 ENCOUNTER — Ambulatory Visit
Admission: EM | Admit: 2022-07-20 | Discharge: 2022-07-20 | Disposition: A | Discharge status: Home / Self Care | Payer: Self-pay | Attending: Internal Medicine | Admitting: Internal Medicine

## 2022-07-20 DIAGNOSIS — R059 Cough, unspecified: Secondary | ICD-10-CM

## 2022-07-20 DIAGNOSIS — J029 Acute pharyngitis, unspecified: Secondary | ICD-10-CM

## 2022-07-20 DIAGNOSIS — R053 Chronic cough: Secondary | ICD-10-CM

## 2022-07-20 DIAGNOSIS — R0981 Nasal congestion: Secondary | ICD-10-CM

## 2022-07-20 MED ORDER — BENZONATATE 100 MG PO CAPS: 100.0000 mg | ORAL_CAPSULE | Freq: Three times a day (TID) | ORAL | 0 refills | Status: DC | PRN

## 2022-07-20 MED ORDER — PREDNISONE 20 MG PO TABS: 40.0000 mg | ORAL_TABLET | Freq: Every day | ORAL | 0 refills | Status: AC

## 2022-07-20 MED ORDER — CHLORASEPTIC 1.4 % MT LIQD: 1.0000 | OROMUCOSAL | 0 refills | Status: DC | PRN

## 2022-07-20 MED ORDER — CETIRIZINE HCL 10 MG PO TABS: 10.0000 mg | ORAL_TABLET | Freq: Every day | ORAL | 0 refills | Status: DC

## 2022-07-20 NOTE — ED Triage Notes (Signed)
Pt c/o cough, sore throat, nasal drainage  Onset ~ 1 month ago

## 2022-07-20 NOTE — Discharge Instructions (Signed)
Your chest x-ray was normal.  I have prescribed 4 different medications to alleviate your symptoms.  Please follow-up if any symptoms persist or worsen.

## 2022-07-20 NOTE — ED Provider Notes (Signed)
[name]    CSN: [id number] Arrival date & time: 07/20/22  0855      History   Chief Complaint Chief Complaint  Patient presents with   Cough    HPI Destiny Cooper is a 42 y.o. female.   Patient presents with cough, sore throat, nasal drainage and congestion.  Patient reports the symptoms have been intermittent for the past 3 to 4 weeks.  Cough is productive at times.  Patient denies any fever.  Reports her family members have had similar symptoms.  Patient reports that she has felt feverish but has not had documented temperature.  Denies history of asthma or COPD and patient does not smoke cigarettes.  Denies chest pain, shortness of breath, ear pain, nausea, vomiting, diarrhea, abdominal pain.  Patient has taken ibuprofen with minimal improvement in symptoms.   Cough   Past Medical History:  Diagnosis Date   Abnormal Pap smear    Active labor at term 04/14/2012   History of UTI    Murmur    Postpartum care following vaginal delivery 06/15/2014   Vaginal delivery 04/14/2012   Vaginal Pap smear, abnormal     Patient Active Problem List   Diagnosis Date Noted   Normal labor 09/24/2020   Language barrier 07/28/2020   Marginal insertion of umbilical cord affecting management of mother 05/29/2020   AMA (advanced maternal age) multigravida 35+ 05/29/2020   Supervision of high risk pregnancy, antepartum 04/10/2020   Early GDM diagnosis 04/04/2020   Vaginal delivery 04/14/2012    Past Surgical History:  Procedure Laterality Date   NO PAST SURGERIES      OB History     Gravida  5   Para  5   Term  5   Preterm      AB      Living  5      SAB      IAB      Ectopic      Multiple  0   Live Births  5            Home Medications    Prior to Admission medications   Medication Sig Start Date End Date Taking? Authorizing Provider  benzonatate (TESSALON) 100 MG capsule Take 1 capsule (100 mg total) by mouth every 8 (eight) hours  as needed for cough. 07/20/22  Yes Ninamarie Keel, Acie Fredrickson, FNP  cetirizine (ZYRTEC) 10 MG tablet Take 1 tablet (10 mg total) by mouth daily. 07/20/22  Yes Chandelle Harkey, Rolly Salter E, FNP  phenol (CHLORASEPTIC) 1.4 % LIQD Use as directed 1 spray in the mouth or throat as needed for throat irritation / pain. 07/20/22  Yes Saliah Crisp, Rolly Salter E, FNP  predniSONE (DELTASONE) 20 MG tablet Take 2 tablets (40 mg total) by mouth daily for 5 days. 07/20/22 07/25/22 Yes Cathline Dowen, Acie Fredrickson, FNP  acetaminophen (TYLENOL) 325 MG tablet Take 2 tablets (650 mg total) by mouth every 6 (six) hours as needed for mild pain, moderate pain, fever or headache (for pain scale < 4). 09/26/20   Sheila Oats, MD  coconut oil OIL Apply 1 application topically as needed (nipple pain). 09/26/20   Sheila Oats, MD  ibuprofen (ADVIL) 600 MG tablet Take 1 tablet (600 mg total) by mouth every 6 (six) hours. 09/26/20   Sheila Oats, MD  Prenatal Vit w/Fe-Methylfol-FA (PNV PO) Take by mouth.    [provider]    Family History Family History  Problem Relation Age of Onset  Cancer Neg Hx    Diabetes Neg Hx    Heart disease Neg Hx    Hypertension Neg Hx     Social History Social History   Tobacco Use   Smoking status: Never   Smokeless tobacco: Never  Vaping Use   Vaping Use: Never used  Substance Use Topics   Alcohol use: Not Currently   Drug use: No     Allergies   Patient has no known allergies.   Review of Systems Review of Systems Per HPI  Physical Exam Triage Vital Signs ED Triage Vitals [07/20/22 0933]  Enc Vitals Group     BP 108/71     Pulse Rate 80     Resp 16     Temp 97.8 F (36.6 C)     Temp Source Oral     SpO2 97 %     Weight      Height      Head Circumference      Peak Flow      Pain Score 8     Pain Loc      Pain Edu?      Excl. in [place]?    No data found.  Updated Vital Signs BP 108/71 (BP Location: Right Arm)   Pulse 80   Temp 97.8 F (36.6 C) (Oral)   Resp 16   SpO2 97%   Breastfeeding No    Visual Acuity Right Eye Distance:   Left Eye Distance:   Bilateral Distance:    Right Eye Near:   Left Eye Near:    Bilateral Near:     Physical Exam Constitutional:      General: She is not in acute distress.    Appearance: Normal appearance. She is not toxic-appearing or diaphoretic.  HENT:     Head: Normocephalic and atraumatic.     Right Ear: Tympanic membrane and ear canal normal.     Left Ear: Tympanic membrane and ear canal normal.     Nose: Congestion present.     Mouth/Throat:     Mouth: Mucous membranes are moist.     Pharynx: No posterior oropharyngeal erythema.  Eyes:     Extraocular Movements: Extraocular movements intact.     Conjunctiva/sclera: Conjunctivae normal.     Pupils: Pupils are equal, round, and reactive to light.  Cardiovascular:     Rate and Rhythm: Normal rate and regular rhythm.     Pulses: Normal pulses.     Heart sounds: Normal heart sounds.  Pulmonary:     Effort: Pulmonary effort is normal. No respiratory distress.     Breath sounds: Normal breath sounds. No stridor. No wheezing, rhonchi or rales.  Abdominal:     General: Abdomen is flat. Bowel sounds are normal.     Palpations: Abdomen is soft.  Musculoskeletal:        General: Normal range of motion.     Cervical back: Normal range of motion.  Skin:    General: Skin is warm and dry.  Neurological:     General: No focal deficit present.     Mental Status: She is alert and oriented to person, place, and time. Mental status is at baseline.  Psychiatric:        Mood and Affect: Mood normal.        Behavior: Behavior normal.      UC Treatments / Results  Labs (all labs ordered are listed, but only abnormal results are displayed) Labs Reviewed - No data  to display  EKG   Radiology DG Chest 2 View  Result Date: 07/20/2022 CLINICAL DATA:  Cough for 1 month.  Sore throat and nasal drainage. EXAM: CHEST - 2 VIEW COMPARISON:  None Available. FINDINGS: The heart size and  mediastinal contours are within normal limits. Both lungs are clear. The visualized skeletal structures are unremarkable. IMPRESSION: No active cardiopulmonary disease. Electronically Signed   By: San Morelle M.D.   On: 07/20/2022 10:09    Procedures Procedures (including critical care time)  Medications Ordered in UC Medications - No data to display  Initial Impression / Assessment and Plan / UC Course  I have reviewed the triage vital signs and the nursing notes.  Pertinent labs & imaging results that were available during my care of the patient were reviewed by me and considered in my medical decision making (see chart for details).     Chest x-ray was negative for any acute cardiopulmonary process.  Differential diagnoses include viral persistent cough, viral bronchitis, allergic rhinitis.  Will treat with prednisone, antihistamine, Chloraseptic spray, cough medication.  Do not think strep testing is necessary given duration of symptoms and appearance on physical exam is not consistent with strep throat.  Therefore, testing was deferred.  Do not think viral testing is necessary given duration of symptoms as it would not change treatment.  No contraindications to steroid therapy noted in patient's history.  Patient was advised to follow-up if symptoms persist or worsen. Final Clinical Impressions(s) / UC Diagnoses   Final diagnoses:  Persistent cough for 3 weeks or longer  Sore throat  Nasal congestion     Discharge Instructions      Your chest x-ray was normal.  I have prescribed 4 different medications to alleviate your symptoms.  Please follow-up if any symptoms persist or worsen.    ED Prescriptions     Medication Sig Dispense Auth. Provider   predniSONE (DELTASONE) 20 MG tablet Take 2 tablets (40 mg total) by mouth daily for 5 days. 10 tablet [place], [place] E, [place]   benzonatate (TESSALON) 100 MG capsule Take 1 capsule (100 mg total) by mouth every 8 (eight) hours as  needed for cough. 21 capsule [place], [place] E, [place]   cetirizine (ZYRTEC) 10 MG tablet Take 1 tablet (10 mg total) by mouth daily. 30 tablet [place], [place] E, [place]   phenol (CHLORASEPTIC) 1.4 % LIQD Use as directed 1 spray in the mouth or throat as needed for throat irritation / pain. 118 mL Teodora Medici, [place]      PDMP not reviewed this encounter.   Teodora Medici, [place] 07/20/22 [phone]

## 2022-08-14 IMAGING — DX DG ANKLE COMPLETE 3+V*R*
3 series · 3 of 3 positions shown · non-contrast
Comparison: None.

CLINICAL DATA: Status post fall.

EXAM:
RIGHT ANKLE - COMPLETE 3+ VIEW

[ankle ap]
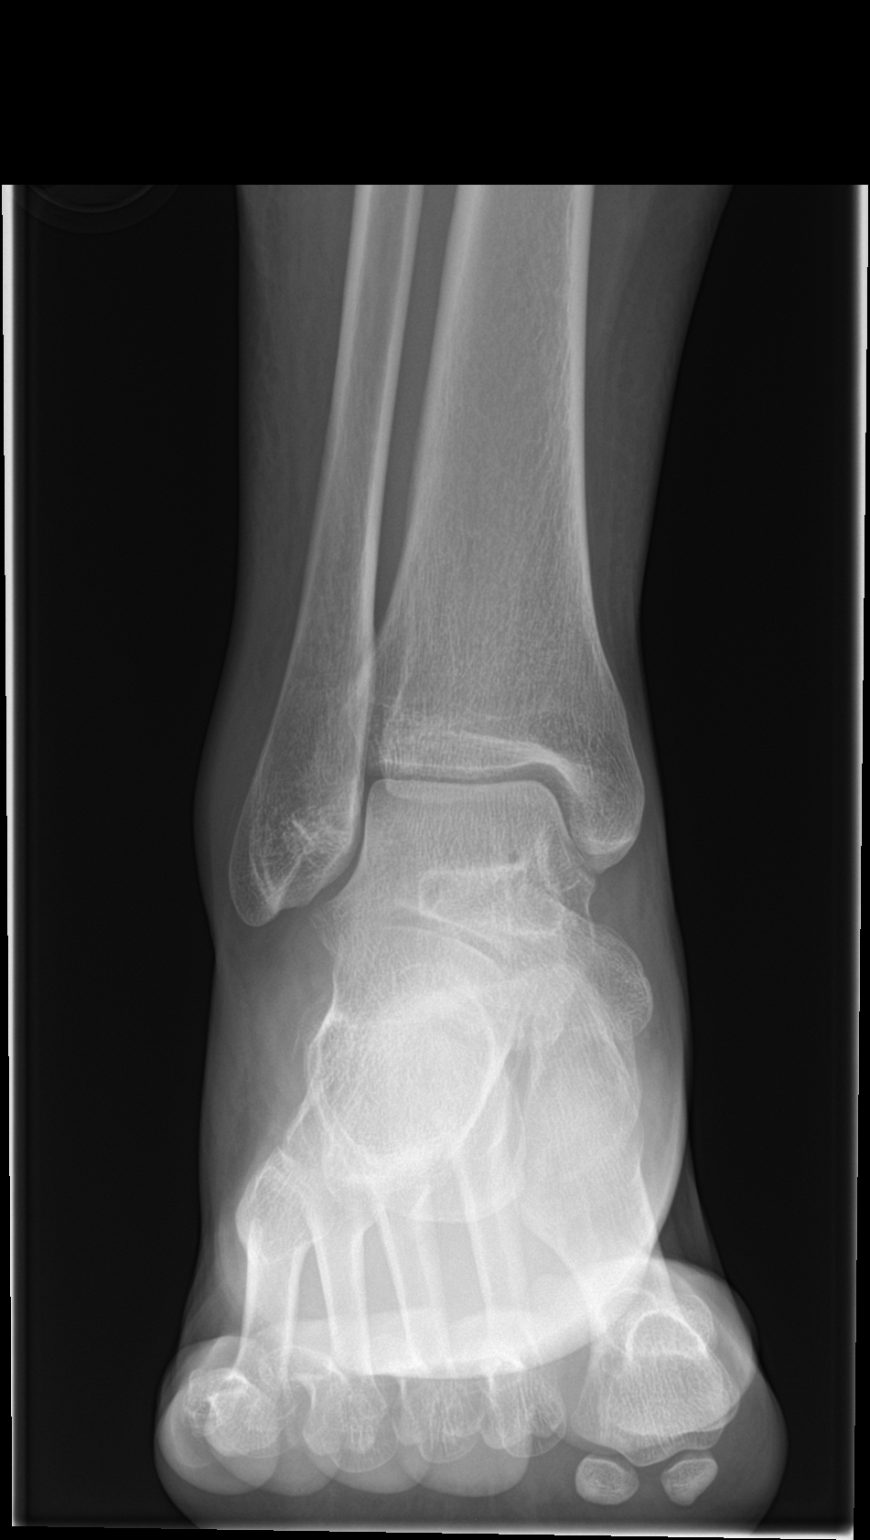

[ankle obl]
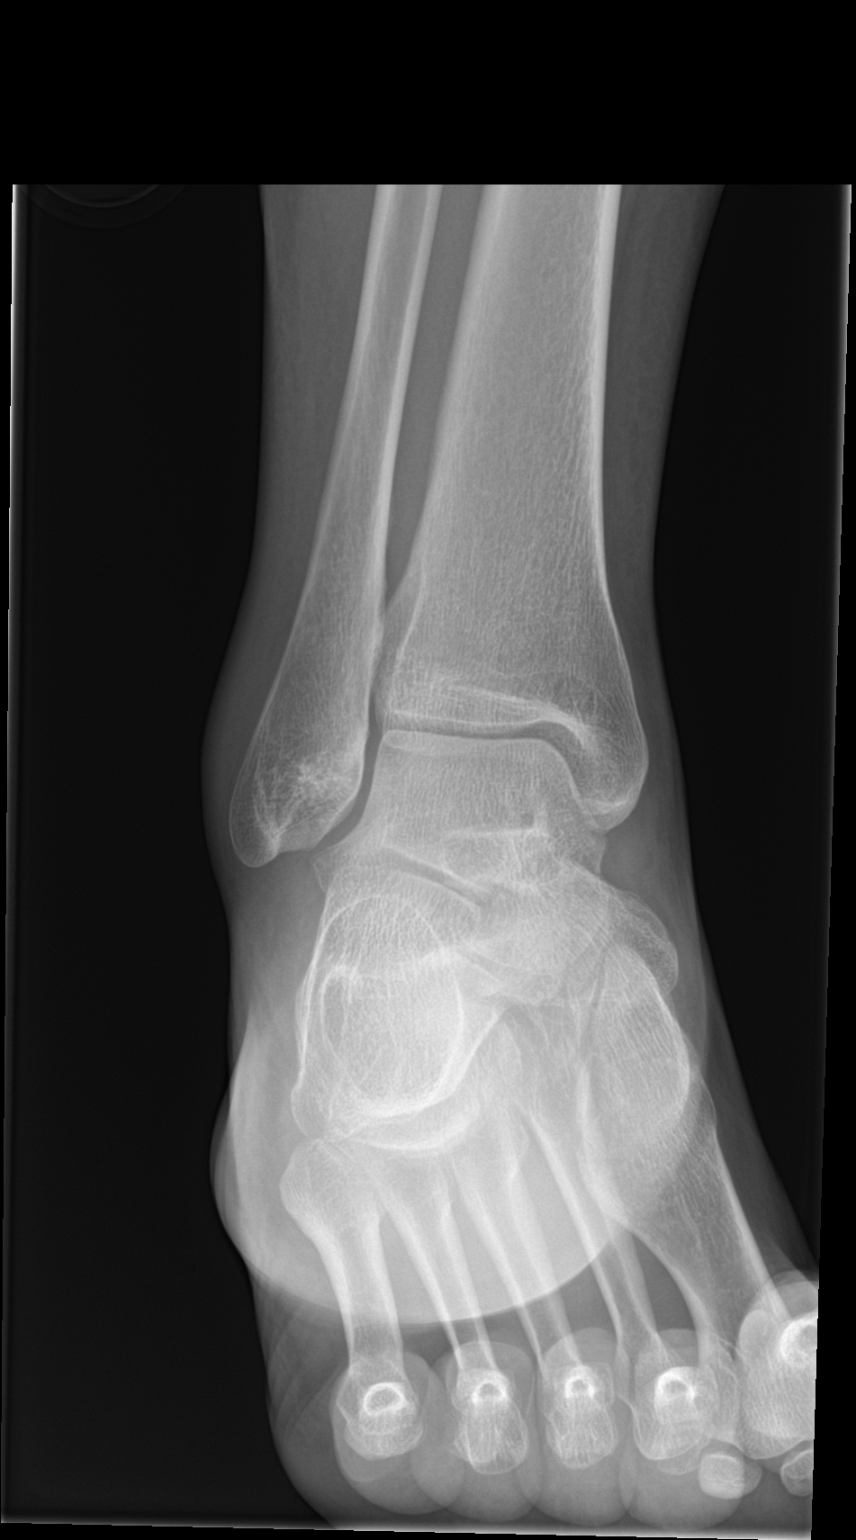

[ankle lat]
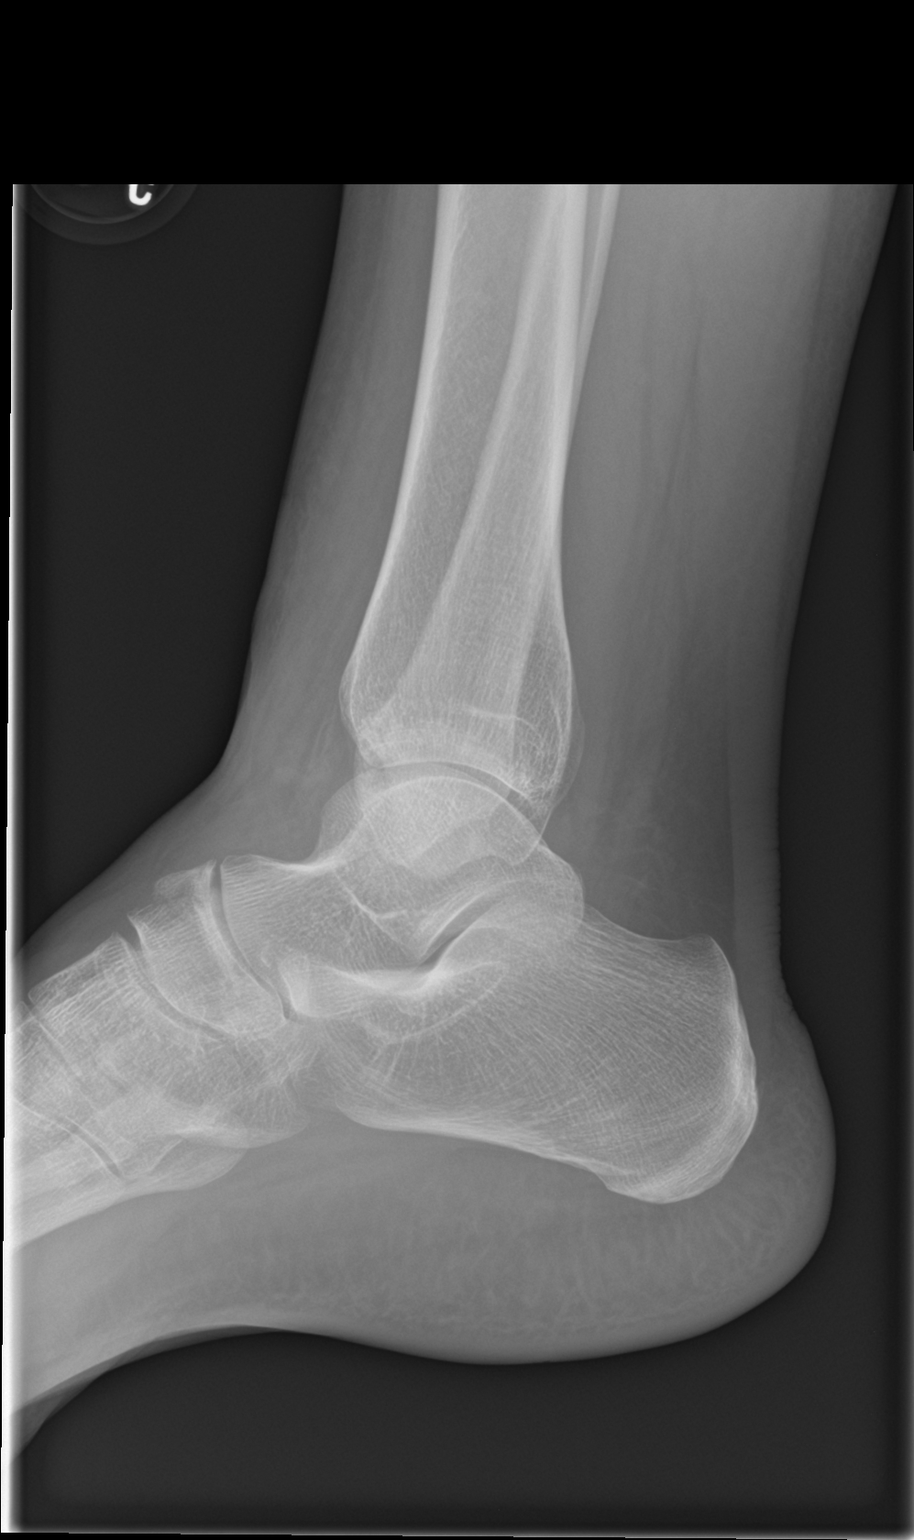

[3 of 3 positions shown; findings below may reference images not displayed]

FINDINGS: There is no evidence of an acute fracture, dislocation, or joint
effusion. There is no evidence of arthropathy. A chronic deformity
is seen along the dorsal aspect of the right navicular bone. Mild to
moderate severity anterior and lateral soft tissue swelling is seen.
IMPRESSION: Anterior and lateral soft tissue swelling without evidence of acute
fracture or dislocation.

## 2023-06-25 NOTE — L&D Delivery Note (Signed)
   Delivery Note:   [id number] at [redacted]w[redacted]d  Admitting diagnosis: Advanced maternal age in multigravida [O09.529] Risks:  Patient Active Problem List   Diagnosis Date Noted   Labor and delivery, indication for care 05/08/2024   Advanced maternal age in multigravida 05/08/2024   Precipitous delivery 05/08/2024   Positive GBS test 05/08/2024   Group B Streptococcus carrier, antepartum 05/04/2024   Grand multiparity 01/20/2024   History of gestational diabetes 11/27/2023   Supervision of high risk pregnancy, antepartum 11/19/2023   Language barrier 07/28/2020   AMA (advanced maternal age) multigravida 35+ 05/29/2020     First Stage:  Induction of labor:N/A  Onset of labor: Contractions at 0700  Augmentation: N/A ROM: SROM  Active labor onset: 1000 Analgesia /Anesthesia/Pain control intrapartum: None  Second Stage:  Complete dilation at 05/08/2024 1611 Onset of pushing at 1609 FHR second stage unknown   CNM (MAU provider) heard patient grunting on the way for patient to be transported to L&D. CNM offered to escort patient and NT to L&D. Upon arrival to elevator doors FOB reported that patient said baby is coming. CNM quickly assessed and noted a large visible bulging bag of water with SROM. Patient immediately involuntarily bearing down & Patient quickly pushed back into Rm 21 in MAU. Upon entering CNM assessed and noted crowning and patient delivered in the wheelchair. FOB,  CNM, MAU RN and NT all present for delivered and supportive.   Delivery of a Live born female  Birth Weight:  PENDING APGAR: 7, 9  Newborn Delivery   Birth date/time: 05/08/2024 16:11:00 Delivery type: Vaginal, Spontaneous    With great maternal pushing efforts fetus delivered in cephalic presentation, position DOA and spontaneously restituted to ROT position. Remaining fetal body expelled and crying infant placed immediately skin to skin.Upon delivery loose nuchal identified and easily reduced after  delivery.   Nuchal Cord: Yes    After 10 mins of life cord double clamped after cessation of pulsation, and cut by FOB.  Collection of cord blood for typing completed. Cord blood donation-True knot observed on delivery.  Arterial cord blood sample-No   Third Stage:  With gentle cord traction Placenta delivered-Spontaneous intact with 3 vessels. Uterine tone firm  bleeding scant Uterotonics: IV pitocin  given Placenta to L&D for dispo.  None laceration identified.  Episiotomy:None Local analgesia: N/A   Repair:N/A  Est. Blood Loss (mL):52.00  Complications: None   Mom to postpartum.  Baby girl to Couplet care / Skin to Skin.  Due to language barrier, an interpreter was present during the history-taking and subsequent discussion (and for part of the physical exam) with this patient. - In person spanish interpreter Destiny Cooper present in room shortly after delivery of the fetus.   Delivery Report:  Review the Delivery Report for details.    Destiny Cooper) Emilio, MSN, CNM  [facility]  05/08/24 4:54 PM

## 2023-10-30 ENCOUNTER — Encounter: Payer: Self-pay | Admitting: *Deleted

## 2023-11-19 ENCOUNTER — Telehealth: Payer: Self-pay | Admitting: *Deleted

## 2023-11-19 VITALS — BP 98/60 | HR 86 | Wt 153.0 lb

## 2023-11-19 DIAGNOSIS — O09529 Supervision of elderly multigravida, unspecified trimester: Secondary | ICD-10-CM

## 2023-11-19 DIAGNOSIS — O0992 Supervision of high risk pregnancy, unspecified, second trimester: Secondary | ICD-10-CM

## 2023-11-19 DIAGNOSIS — O0990 Supervision of high risk pregnancy, unspecified, unspecified trimester: Secondary | ICD-10-CM | POA: Insufficient documentation

## 2023-11-19 DIAGNOSIS — O09522 Supervision of elderly multigravida, second trimester: Secondary | ICD-10-CM

## 2023-11-19 DIAGNOSIS — Z3A14 14 weeks gestation of pregnancy: Secondary | ICD-10-CM

## 2023-11-19 NOTE — Progress Notes (Signed)
 New OB Intake  I connected with Destiny Cooper  on 11/19/23 at  8:15 AM EDT by In person in office.  and verified that I am speaking with the correct person using two identifiers.   I explained I am completing New OB Intake today. We discussed EDD of 05/14/2024, by LMP. Pt is Destiny Cooper. I reviewed her allergies, medications and Medical/Surgical/OB history.    Patient Active Problem List   Diagnosis Date Noted   Supervision of high risk pregnancy, antepartum 11/19/2023   Language barrier 07/28/2020   AMA (advanced maternal age) multigravida 35+ 05/29/2020     Concerns addressed today  Delivery Plans Plans to deliver at [facility]. Discussed the nature of our practice with multiple providers including residents and students. Due to the size of the practice, the delivering provider may not be the same as those providing prenatal care.   Patient is not interested in water birth.  MyChart/Babyscripts MyChart access not verified. I explained pt will have some visits in office and some virtually. Offered to have staff to assist to sign up for MyChart at checkout. Patient declines  Babyscripts    Blood Pressure Cuff/Weight Scale She will be given BP cuff at new ob.  Explained after first prenatal appt pt will check weekly and document. she prefers to document on paper and bring to office with each ob visit.  Patient does not have weight scale .  Anatomy US  Discussed dating US . Patient is adopt-a -Mom, declines dating US . Explained first scheduled US  will be around 19 weeks. Anatomy US  will be scheduled at [facility] . Nurse called but they are unable to schedule for June because their schedule not open.   Is patient a CenteringPregnancy candidate?  Declined Declined due to Group setting   Is patient a Mom+Baby Combined Care candidate?  Not a candidate    Is patient a candidate for Babyscripts Optimization? No, due to language barrier,   First visit review I reviewed new OB appt with  patient. Explained pt will be seen by Dr. Racheal Buddle at first visit. Discussed [place] genetic screening with patient. She declines [employer] and [employer].. Routine prenatal labs drawn today.   Last Pap No results found for: "DIAGPAP"  Destiny Cooper 11/19/2023  8:54 AM

## 2023-11-20 LAB — CBC/D/PLT+RPR+RH+ABO+RUBIGG...
Antibody Screen: NEGATIVE
Basophils Absolute: 0 10*3/uL (ref 0.0–0.2)
Basos: 0 %
EOS (ABSOLUTE): 0.2 10*3/uL (ref 0.0–0.4)
Eos: 2 %
HCV Ab: NONREACTIVE
HIV Screen 4th Generation wRfx: NONREACTIVE
Hematocrit: 35.5 % (ref 34.0–46.6)
Hemoglobin: 11.6 g/dL (ref 11.1–15.9)
Hepatitis B Surface Ag: NEGATIVE
Immature Grans (Abs): 0 10*3/uL (ref 0.0–0.1)
Immature Granulocytes: 0 %
Lymphocytes Absolute: 2.1 10*3/uL (ref 0.7–3.1)
Lymphs: 32 %
MCH: 28.4 pg (ref 26.6–33.0)
MCHC: 32.7 g/dL (ref 31.5–35.7)
MCV: 87 fL (ref 79–97)
Monocytes Absolute: 0.3 10*3/uL (ref 0.1–0.9)
Monocytes: 5 %
Neutrophils Absolute: 3.9 10*3/uL (ref 1.4–7.0)
Neutrophils: 61 %
Platelets: 265 10*3/uL (ref 150–450)
RBC: 4.08 x10E6/uL (ref 3.77–5.28)
RDW: 12.7 % (ref 11.7–15.4)
RPR Ser Ql: NONREACTIVE
Rh Factor: POSITIVE
Rubella Antibodies, IGG: 8.17 {index} (ref >0.99)
WBC: 6.6 10*3/uL (ref 3.4–10.8)

## 2023-11-20 LAB — HCV INTERPRETATION

## 2023-11-24 ENCOUNTER — Ambulatory Visit: Payer: Self-pay | Admitting: Obstetrics and Gynecology

## 2023-11-27 ENCOUNTER — Encounter: Payer: Self-pay | Admitting: Obstetrics and Gynecology

## 2023-11-27 ENCOUNTER — Other Ambulatory Visit: Payer: Self-pay

## 2023-11-27 ENCOUNTER — Ambulatory Visit: Payer: Self-pay | Admitting: Obstetrics and Gynecology

## 2023-11-27 VITALS — BP 108/62 | HR 86 | Wt 156.0 lb

## 2023-11-27 DIAGNOSIS — Z758 Other problems related to medical facilities and other health care: Secondary | ICD-10-CM

## 2023-11-27 DIAGNOSIS — Z603 Acculturation difficulty: Secondary | ICD-10-CM

## 2023-11-27 DIAGNOSIS — O0992 Supervision of high risk pregnancy, unspecified, second trimester: Secondary | ICD-10-CM

## 2023-11-27 DIAGNOSIS — O0990 Supervision of high risk pregnancy, unspecified, unspecified trimester: Secondary | ICD-10-CM

## 2023-11-27 DIAGNOSIS — Z3A15 15 weeks gestation of pregnancy: Secondary | ICD-10-CM

## 2023-11-27 DIAGNOSIS — O09522 Supervision of elderly multigravida, second trimester: Secondary | ICD-10-CM

## 2023-11-27 DIAGNOSIS — Z8632 Personal history of gestational diabetes: Secondary | ICD-10-CM

## 2023-11-27 LAB — HEMOGLOBIN A1C
Est. average glucose Bld gHb Est-mCnc: 114 mg/dL
Hgb A1c MFr Bld: 5.6 % (ref 4.8–5.6)

## 2023-11-27 MED ORDER — ASPIRIN 81 MG PO CHEW: 81.0000 mg | CHEWABLE_TABLET | Freq: Every day | ORAL | 3 refills | Status: DC

## 2023-11-27 NOTE — Progress Notes (Signed)
 INITIAL PRENATAL VISIT NOTE  Subjective:  Destiny Cooper is a 43 y.o. G6P5005 at 105w6d by sure LMP being seen today for her initial prenatal visit.  She has an obstetric history significant for SVD x 5. She has a medical history significant for gestational diabetes.  Patient reports no complaints.  Contractions: Not present. Vag. Bleeding: None.  Movement: Present. Denies leaking of fluid.    Past Medical History:  Diagnosis Date   Abnormal Pap smear    Active labor at term 04/14/2012   GDM (gestational diabetes mellitus)    History of UTI    Murmur    Postpartum care following vaginal delivery 06/15/2014   Vaginal delivery 04/14/2012   Vaginal Pap smear, abnormal     Past Surgical History:  Procedure Laterality Date   NO PAST SURGERIES      OB History  Gravida Para Term Preterm AB Living  6 5 5   5   SAB IAB Ectopic Multiple Live Births     0 5    # Outcome Date GA Lbr Len/2nd Weight Sex Type Anes PTL Lv  6 Current           5 Term 09/24/20 [redacted]w[redacted]d / 00:02 7 lb 6.2 oz (3.35 kg) M Vag-Spont None  LIV     Birth Comments: no anomalies, GDM  4 Term 06/13/14 [redacted]w[redacted]d  8 lb 9.4 oz (3.895 kg) F Vag-Spont None  LIV     Birth Comments: delivered at home, then admitted  3 Term 04/14/12 [redacted]w[redacted]d 02:02 / 00:06 8 lb 10.5 oz (3.925 kg) F Vag-Spont None  LIV     Birth Comments: none  2 Term 05/10/06 [redacted]w[redacted]d  7 lb (3.175 kg)  Vag-Spont None  LIV     Birth Comments: wnl  1 Term 07/30/01 [redacted]w[redacted]d  7 lb (3.175 kg)  Vag-Spont None  LIV     Birth Comments: wnl    Social History   Socioeconomic History   Marital status: Married    Spouse name: Not on file   Number of children: Not on file   Years of education: Not on file   Highest education level: Not on file  Occupational History   Not on file  Tobacco Use   Smoking status: Never   Smokeless tobacco: Never  Vaping Use   Vaping status: Never Used  Substance and Sexual Activity   Alcohol use: Not Currently    Comment: rare   Drug  use: No   Sexual activity: Yes    Birth control/protection: None  Other Topics Concern   Not on file  Social History Narrative   Not on file   Social Drivers of Health   Financial Resource Strain: Medium Risk (05/22/2021)   Received [facility], [facility]   Overall Financial Resource Strain (CARDIA)    Difficulty of Paying Living Expenses: Somewhat hard  Food Insecurity: Food Insecurity Present (05/22/2021)   Received from [facility], [facility]   Hunger Vital Sign    Worried About Running Out of Food in the Last Year: Sometimes true    Ran Out of Food in the Last Year: Sometimes true  Transportation Needs: Unmet Transportation Needs (05/22/2021)   Received from [facility], [facility]   [facility] - Transportation    Lack of Transportation (Medical): Yes    Lack of Transportation (Non-Medical): Yes  Physical Activity: Not on file  Stress: Not on file  Social Connections: Not on file  Family History  Problem Relation Age of Onset   Cancer Neg Hx    Diabetes Neg Hx    Heart disease Neg Hx    Hypertension Neg Hx      Current Outpatient Medications:    acetaminophen  (TYLENOL ) 325 MG tablet, Take 2 tablets (650 mg total) by mouth every 6 (six) hours as needed for mild pain, moderate pain, fever or headache (for pain scale < 4)., Disp: , Rfl:    aspirin  81 MG chewable tablet, Chew 1 tablet (81 mg total) by mouth daily., Disp: 90 tablet, Rfl: 3   benzonatate  (TESSALON ) 100 MG capsule, Take 1 capsule (100 mg total) by mouth every 8 (eight) hours as needed for cough., Disp: 21 capsule, Rfl: 0   cetirizine  (ZYRTEC ) 10 MG tablet, Take 1 tablet (10 mg total) by mouth daily., Disp: 30 tablet, Rfl: 0   coconut oil OIL, Apply 1 application topically as needed (nipple pain)., Disp: , Rfl: 0   phenol (CHLORASEPTIC) 1.4 % LIQD, Use as directed 1 spray in the mouth or throat as needed for throat irritation / pain., Disp: 118 mL, Rfl: 0   Prenatal Vit  w/Fe-Methylfol-FA (PNV PO), Take by mouth., Disp: , Rfl:   No Known Allergies  Review of Systems: Negative except for what is mentioned in HPI.  Objective:   Vitals:   11/27/23 0922  BP: 108/62  Pulse: 86  Weight: 156 lb (70.8 kg)    Fetal Status: Fetal Heart Rate (bpm): 150   Movement: Present     Physical Exam: BP 108/62   Pulse 86   Wt 156 lb (70.8 kg)   LMP 08/08/2023 (Exact Date)   BMI 28.53 kg/m  CONSTITUTIONAL: Well-developed, well-nourished female in no acute distress.  NEUROLOGIC: Alert and oriented to person, place, and time. Normal reflexes, muscle tone coordination. No cranial nerve deficit noted. PSYCHIATRIC: Normal mood and affect. Normal behavior. Normal judgment and thought content. SKIN: Skin is warm and dry. No rash noted. Not diaphoretic. No erythema. No pallor. HENT:  Normocephalic, atraumatic, External right and left ear normal. Oropharynx is clear and moist EYES: Conjunctivae and EOM are normal.  NECK: Normal range of motion, supple, no masses CARDIOVASCULAR: Normal heart rate noted, regular rhythm RESPIRATORY: Effort and breath sounds normal, no problems with respiration noted BREASTS: deferred ABDOMEN: Soft, nontender, nondistended, gravid. GU: deferred MUSCULOSKELETAL: Normal range of motion. EXT:  No edema and no tenderness. 2+ distal pulses.   Assessment and Plan:  Pregnancy: G6P5005 at [redacted]w[redacted]d by  LMP  1. Language barrier (Primary) Live interpreter  2. Multigravida of advanced maternal age in second trimester  - aspirin  81 MG chewable tablet; Chew 1 tablet (81 mg total) by mouth daily.  Dispense: 90 tablet; Refill: 3  3. Supervision of high risk pregnancy, antepartum Continue routine prenatal care  - Hemoglobin A1c - aspirin  81 MG chewable tablet; Chew 1 tablet (81 mg total) by mouth daily.  Dispense: 90 tablet; Refill: 3   Preterm labor symptoms and general obstetric precautions including but not limited to vaginal bleeding,  contractions, leaking of fluid and fetal movement were reviewed in detail with the patient.  Please refer to After Visit Summary for other counseling recommendations.   Return in about 4 weeks (around 12/25/2023) for [facility], in person.  Abigail Abler 11/27/2023 10:06 AM

## 2023-12-01 ENCOUNTER — Ambulatory Visit: Payer: Self-pay | Admitting: Obstetrics and Gynecology

## 2023-12-29 ENCOUNTER — Other Ambulatory Visit: Payer: Self-pay

## 2023-12-29 ENCOUNTER — Ambulatory Visit: Payer: Self-pay | Admitting: Obstetrics and Gynecology

## 2023-12-29 VITALS — BP 102/67 | HR 80 | Wt 158.2 lb

## 2023-12-29 DIAGNOSIS — Z603 Acculturation difficulty: Secondary | ICD-10-CM

## 2023-12-29 DIAGNOSIS — Z758 Other problems related to medical facilities and other health care: Secondary | ICD-10-CM

## 2023-12-29 DIAGNOSIS — O09522 Supervision of elderly multigravida, second trimester: Secondary | ICD-10-CM

## 2023-12-29 DIAGNOSIS — O0990 Supervision of high risk pregnancy, unspecified, unspecified trimester: Secondary | ICD-10-CM

## 2023-12-29 DIAGNOSIS — O09892 Supervision of other high risk pregnancies, second trimester: Secondary | ICD-10-CM

## 2023-12-29 DIAGNOSIS — Z8632 Personal history of gestational diabetes: Secondary | ICD-10-CM

## 2023-12-29 DIAGNOSIS — Z3A20 20 weeks gestation of pregnancy: Secondary | ICD-10-CM

## 2023-12-29 NOTE — Progress Notes (Signed)
   PRENATAL VISIT NOTE  Subjective:  Destiny Cooper is a 43 y.o. G6P5005 at [redacted]w[redacted]d being seen today for ongoing prenatal care.  She is currently monitored for the following issues for this high-risk pregnancy and has AMA (advanced maternal age) multigravida 35+; Language barrier; Supervision of high risk pregnancy, antepartum; and History of gestational diabetes on their problem list.  Patient doing well with no acute concerns today. She reports no complaints.   . Vag. Bleeding: None.  Movement: Present. Denies leaking of fluid.   The following portions of the patient's history were reviewed and updated as appropriate: allergies, current medications, past family history, past medical history, past social history, past surgical history and problem list. Problem list updated.  Objective:   Vitals:   12/29/23 1034  BP: 102/67  Pulse: 80  Weight: 158 lb 3.2 oz (71.8 kg)    Fetal Status: Fetal Heart Rate (bpm): 156 Fundal Height: 20 cm Movement: Present     General:  Alert, oriented and cooperative. Patient is in no acute distress.  Skin: Skin is warm and dry. No rash noted.   Cardiovascular: Normal heart rate noted  Respiratory: Normal respiratory effort, no problems with respiration noted  Abdomen: Soft, gravid, appropriate for gestational age.  Pain/Pressure: Present (cramping)     Pelvic: Cervical exam deferred        Extremities: Normal range of motion.     Mental Status:  Normal mood and affect. Normal behavior. Normal judgment and thought content.   Assessment and Plan:  Pregnancy: G6P5005 at [redacted]w[redacted]d  1. [redacted] weeks gestation of pregnancy (Primary)   2. Supervision of high risk pregnancy, antepartum Continue routine prenatal care  - AFP, Serum, Open Spina Bifida - US  MFM OB DETAIL +14 WK; Future  3. Language barrier Live interpreter present  4. Multigravida of advanced maternal age in second trimester  - US  MFM OB DETAIL +14 WK; Future  5. History of gestational  diabetes Early A1c was normal  Preterm labor symptoms and general obstetric precautions including but not limited to vaginal bleeding, contractions, leaking of fluid and fetal movement were reviewed in detail with the patient.  Please refer to After Visit Summary for other counseling recommendations.   Return in about 4 weeks (around 01/26/2024) for [facility], in person.   Jerilynn Buddle, MD Faculty Attending Center for [facility]

## 2023-12-31 ENCOUNTER — Ambulatory Visit: Payer: Self-pay | Admitting: Obstetrics and Gynecology

## 2023-12-31 DIAGNOSIS — O0990 Supervision of high risk pregnancy, unspecified, unspecified trimester: Secondary | ICD-10-CM

## 2023-12-31 LAB — AFP, SERUM, OPEN SPINA BIFIDA
AFP MoM: 1.75
AFP Value: 95.8 ng/mL
Gest. Age on Collection Date: 20 wk
Maternal Age At EDD: 41.9 a
OSBR Risk 1 IN: 1442
Test Results:: NEGATIVE
Weight: 158 [lb_av]

## 2024-01-07 ENCOUNTER — Telehealth: Payer: Self-pay

## 2024-01-07 NOTE — Telephone Encounter (Addendum)
 Pt called and but did not leave a message.  Called pt with Spanish Interpreter Eda R. Pt states that someone was suppose to call her with an appt for US  and has not received it yet.  I informed pt that she was scheduled for 01/30/24 and her appt info in is in her MyChart.  Pt verbalized understanding with no further questions.   Oaklee Sunga,RN

## 2024-01-20 DIAGNOSIS — Z641 Problems related to multiparity: Secondary | ICD-10-CM | POA: Insufficient documentation

## 2024-01-26 ENCOUNTER — Other Ambulatory Visit: Payer: Self-pay

## 2024-01-26 ENCOUNTER — Ambulatory Visit: Payer: Self-pay | Admitting: Family Medicine

## 2024-01-26 VITALS — BP 107/70 | HR 96 | Wt 159.8 lb

## 2024-01-26 DIAGNOSIS — O0992 Supervision of high risk pregnancy, unspecified, second trimester: Secondary | ICD-10-CM

## 2024-01-26 DIAGNOSIS — O09522 Supervision of elderly multigravida, second trimester: Secondary | ICD-10-CM

## 2024-01-26 DIAGNOSIS — Z3A24 24 weeks gestation of pregnancy: Secondary | ICD-10-CM

## 2024-01-26 DIAGNOSIS — Z603 Acculturation difficulty: Secondary | ICD-10-CM

## 2024-01-26 DIAGNOSIS — Z758 Other problems related to medical facilities and other health care: Secondary | ICD-10-CM

## 2024-01-26 DIAGNOSIS — Z641 Problems related to multiparity: Secondary | ICD-10-CM

## 2024-01-26 DIAGNOSIS — Z8632 Personal history of gestational diabetes: Secondary | ICD-10-CM

## 2024-01-26 DIAGNOSIS — O0990 Supervision of high risk pregnancy, unspecified, unspecified trimester: Secondary | ICD-10-CM

## 2024-01-26 NOTE — Progress Notes (Signed)
   PRENATAL VISIT NOTE  Subjective:  Destiny Cooper is a 43 y.o. G6P5005 at [redacted]w[redacted]d being seen today for ongoing prenatal care.  She is currently monitored for the following issues for this high-risk pregnancy and has AMA (advanced maternal age) multigravida 35+; Language barrier; Supervision of high risk pregnancy, antepartum; History of gestational diabetes; and Grand multiparity on their problem list.  Patient reports no complaints.   . Vag. Bleeding: None.  Movement: Present. Denies leaking of fluid.   The following portions of the patient's history were reviewed and updated as appropriate: allergies, current medications, past family history, past medical history, past social history, past surgical history and problem list.   Objective:    Vitals:   01/26/24 1020  BP: 107/70  Pulse: 96  Weight: 159 lb 12.8 oz (72.5 kg)    Fetal Status:  Fetal Heart Rate (bpm): 149 Fundal Height: 25 cm Movement: Present    General: Alert, oriented and cooperative. Patient is in no acute distress.  Skin: Skin is warm and dry. No rash noted.   Cardiovascular: Normal heart rate noted  Respiratory: Normal respiratory effort, no problems with respiration noted  Abdomen: Soft, gravid, appropriate for gestational age.  Pain/Pressure: Present (cramps)     Pelvic: Cervical exam deferred        Extremities: Normal range of motion.  Edema: None  Mental Status: Normal mood and affect. Normal behavior. Normal judgment and thought content.   Assessment and Plan:  Pregnancy: G6P5005 at [redacted]w[redacted]d 1. Supervision of high risk pregnancy, antepartum (Primary) Continue prenatal care.  2. Language barrier Spanish interpreter: video used  3. History of gestational diabetes Normal early A1C For 2 hour next visit  4. Grand multiparity At risk of PPH  5. Multigravida of advanced maternal age in second trimester Declined genetics  6. [redacted] weeks gestation of pregnancy   Preterm labor symptoms and general  obstetric precautions including but not limited to vaginal bleeding, contractions, leaking of fluid and fetal movement were reviewed in detail with the patient. Please refer to After Visit Summary for other counseling recommendations.   Return in 4 weeks (on 02/23/2024) for 28 wk labs.  Future Appointments  Date Time Provider Department Center  01/30/2024  9:00 AM [facility] PROVIDER 1 [facility]  01/30/2024  9:30 AM [facility]MFC US2 [facility]  02/24/2024  9:20 AM [facility]OCA LAB [facility]  02/24/2024 10:55 AM Nicholaus Burnard HERO, MD [facility]    Glenys GORMAN Birk, MD

## 2024-01-30 ENCOUNTER — Other Ambulatory Visit: Payer: Self-pay | Admitting: *Deleted

## 2024-01-30 ENCOUNTER — Ambulatory Visit: Payer: Self-pay | Attending: Obstetrics and Gynecology | Admitting: Obstetrics

## 2024-01-30 ENCOUNTER — Ambulatory Visit: Payer: Self-pay

## 2024-01-30 VITALS — BP 114/54

## 2024-01-30 DIAGNOSIS — O0942 Supervision of pregnancy with grand multiparity, second trimester: Secondary | ICD-10-CM

## 2024-01-30 DIAGNOSIS — O09522 Supervision of elderly multigravida, second trimester: Secondary | ICD-10-CM

## 2024-01-30 DIAGNOSIS — O0990 Supervision of high risk pregnancy, unspecified, unspecified trimester: Secondary | ICD-10-CM

## 2024-01-30 DIAGNOSIS — O0992 Supervision of high risk pregnancy, unspecified, second trimester: Secondary | ICD-10-CM | POA: Insufficient documentation

## 2024-01-30 DIAGNOSIS — Z641 Problems related to multiparity: Secondary | ICD-10-CM

## 2024-01-30 DIAGNOSIS — Z3A25 25 weeks gestation of pregnancy: Secondary | ICD-10-CM

## 2024-01-30 DIAGNOSIS — O09292 Supervision of pregnancy with other poor reproductive or obstetric history, second trimester: Secondary | ICD-10-CM

## 2024-01-30 NOTE — Progress Notes (Signed)
 MFM Consult Note  Destiny Cooper is currently at 25 weeks and 0 days.  She was seen due to advanced maternal age (43 years old) and grand multiparity.  She denies any significant past medical history and denies any problems in her current pregnancy.    She has not had a screening test for fetal aneuploidy drawn in her current pregnancy.  She was informed that the fetal growth and amniotic fluid level were appropriate for her gestational age.   There were no obvious fetal anomalies noted on today's ultrasound exam.  The patient was informed that anomalies may be missed due to technical limitations. If the fetus is in a suboptimal position or maternal habitus is increased, visualization of the fetus in the maternal uterus may be impaired.  The increased risk of fetal aneuploidy due to advanced maternal age was discussed.   Due to advanced maternal age, the patient was offered and declined an amniocentesis today for definitive diagnosis of fetal aneuploidy.    She was also offered and declined a cell free DNA test to screen for fetal aneuploidy.  As she is over 80 years old, we will start weekly fetal testing at 34 weeks.    A follow-up growth scan was scheduled in 5 weeks.    The patient stated that all of her questions were answered today.    All conversations were held with the patient today with the help of a Spanish interpreter.  A total of 30 minutes was spent counseling and coordinating the care for this patient.  Greater than 50% of the time was spent in direct face-to-face contact.

## 2024-02-24 ENCOUNTER — Other Ambulatory Visit: Payer: Self-pay

## 2024-02-24 ENCOUNTER — Ambulatory Visit: Payer: Self-pay | Admitting: Obstetrics and Gynecology

## 2024-02-24 VITALS — BP 112/71 | HR 89 | Wt 161.5 lb

## 2024-02-24 DIAGNOSIS — Z8632 Personal history of gestational diabetes: Secondary | ICD-10-CM

## 2024-02-24 DIAGNOSIS — O09522 Supervision of elderly multigravida, second trimester: Secondary | ICD-10-CM

## 2024-02-24 DIAGNOSIS — Z758 Other problems related to medical facilities and other health care: Secondary | ICD-10-CM

## 2024-02-24 DIAGNOSIS — Z603 Acculturation difficulty: Secondary | ICD-10-CM

## 2024-02-24 DIAGNOSIS — O0990 Supervision of high risk pregnancy, unspecified, unspecified trimester: Secondary | ICD-10-CM

## 2024-02-24 DIAGNOSIS — Z3A28 28 weeks gestation of pregnancy: Secondary | ICD-10-CM

## 2024-02-24 DIAGNOSIS — O0992 Supervision of high risk pregnancy, unspecified, second trimester: Secondary | ICD-10-CM

## 2024-02-24 DIAGNOSIS — Z641 Problems related to multiparity: Secondary | ICD-10-CM

## 2024-02-24 NOTE — Progress Notes (Signed)
   PRENATAL VISIT NOTE  Subjective:  Destiny Cooper is a 43 y.o. G6P5005 at [redacted]w[redacted]d being seen today for ongoing prenatal care.  She is currently monitored for the following issues for this high-risk pregnancy and has AMA (advanced maternal age) multigravida 35+; Language barrier; Supervision of high risk pregnancy, antepartum; History of gestational diabetes; and Grand multiparity on their problem list.  Patient reports no complaints. Needs referral to dental office for cleaning. Contractions: Irritability. Vag. Bleeding: None.  Movement: Present. Denies leaking of fluid.   The following portions of the patient's history were reviewed and updated as appropriate: allergies, current medications, past family history, past medical history, past social history, past surgical history and problem list.   Objective:    Vitals:   02/24/24 1125  BP: 112/71  Pulse: 89  Weight: 161 lb 8 oz (73.3 kg)    Fetal Status:  Fetal Heart Rate (bpm): 143   Movement: Present    General: Alert, oriented and cooperative. Patient is in no acute distress.  Skin: Skin is warm and dry. No rash noted.   Cardiovascular: Normal heart rate noted  Respiratory: Normal respiratory effort, no problems with respiration noted  Abdomen: Soft, gravid, appropriate for gestational age.  Pain/Pressure: Present     Pelvic: Cervical exam deferred        Extremities: Normal range of motion.  Edema: None  Mental Status: Normal mood and affect. Normal behavior. Normal judgment and thought content.   Assessment and Plan:  Pregnancy: G6P5005 at [redacted]w[redacted]d  1. Multigravida of advanced maternal age in second trimester (Primary) Anatomy wnl, will start weekly testing at 34 weeks  2. Language barrier Engineer, structural used  3. Supervision of high risk pregnancy, antepartum 2 hr GTT today Needs dental referral for cleaning, letter given  4. History of gestational diabetes Diet control  5. Grand multiparity  6. [redacted] weeks  gestation of pregnancy   Preterm labor symptoms and general obstetric precautions including but not limited to vaginal bleeding, contractions, leaking of fluid and fetal movement were reviewed in detail with the patient. Please refer to After Visit Summary for other counseling recommendations.   Return in about 2 weeks (around 03/09/2024) for high OB.  Future Appointments  Date Time Provider Department Center  03/09/2024  3:30 PM [facility] PROVIDER 1 [facility]  03/09/2024  3:45 PM W[facility] US5 [facility]  04/06/2024  1:15 PM [facility] PROVIDER 1 [facility]  04/06/2024  1:30 PM [facility] US5 [facility]  04/13/2024  1:15 PM [facility] PROVIDER 1 [facility]  04/13/2024  1:30 PM [facility] US5 [facility]  04/15/2024  1:50 PM Celestia Rosaline SQUIBB, NP [facility]  04/20/2024  1:15 PM [facility] PROVIDER 1 [facility]-M[facility]  04/20/2024  1:30 PM WMC-MFC US4 W[facility]-MFCUS [facility]    Burnard CHRISTELLA Moats, MD

## 2024-02-25 LAB — CBC
Hematocrit: 34.2 % (ref 34.0–46.6)
Hemoglobin: 11.1 g/dL (ref 11.1–15.9)
MCH: 28.8 pg (ref 26.6–33.0)
MCHC: 32.5 g/dL (ref 31.5–35.7)
MCV: 89 fL (ref 79–97)
Platelets: 265 x10E3/uL (ref 150–450)
RBC: 3.86 x10E6/uL (ref 3.77–5.28)
RDW: 12.7 % (ref 11.7–15.4)
WBC: 6.1 x10E3/uL (ref 3.4–10.8)

## 2024-02-25 LAB — HIV ANTIBODY (ROUTINE TESTING W REFLEX): HIV Screen 4th Generation wRfx: NONREACTIVE

## 2024-02-25 LAB — GLUCOSE TOLERANCE, 2 HOURS W/ 1HR
Glucose, 1 hour: 175 mg/dL (ref 70–179)
Glucose, 2 hour: 141 mg/dL (ref 70–152)
Glucose, Fasting: 69 mg/dL — ABNORMAL LOW (ref 70–91)

## 2024-02-25 LAB — RPR: RPR Ser Ql: NONREACTIVE

## 2024-02-26 ENCOUNTER — Ambulatory Visit: Payer: Self-pay | Admitting: Family Medicine

## 2024-02-26 DIAGNOSIS — O0990 Supervision of high risk pregnancy, unspecified, unspecified trimester: Secondary | ICD-10-CM

## 2024-03-09 ENCOUNTER — Ambulatory Visit: Payer: Self-pay | Admitting: Obstetrics and Gynecology

## 2024-03-09 ENCOUNTER — Other Ambulatory Visit: Payer: Self-pay | Admitting: *Deleted

## 2024-03-09 ENCOUNTER — Encounter: Payer: Self-pay | Admitting: Obstetrics and Gynecology

## 2024-03-09 ENCOUNTER — Other Ambulatory Visit: Payer: Self-pay

## 2024-03-09 ENCOUNTER — Ambulatory Visit (HOSPITAL_BASED_OUTPATIENT_CLINIC_OR_DEPARTMENT_OTHER): Payer: Self-pay

## 2024-03-09 ENCOUNTER — Ambulatory Visit: Payer: Self-pay | Attending: Obstetrics | Admitting: Obstetrics and Gynecology

## 2024-03-09 VITALS — BP 99/66 | HR 91 | Wt 161.2 lb

## 2024-03-09 DIAGNOSIS — O09522 Supervision of elderly multigravida, second trimester: Secondary | ICD-10-CM

## 2024-03-09 DIAGNOSIS — O09293 Supervision of pregnancy with other poor reproductive or obstetric history, third trimester: Secondary | ICD-10-CM | POA: Insufficient documentation

## 2024-03-09 DIAGNOSIS — O09523 Supervision of elderly multigravida, third trimester: Secondary | ICD-10-CM

## 2024-03-09 DIAGNOSIS — Z3A30 30 weeks gestation of pregnancy: Secondary | ICD-10-CM

## 2024-03-09 DIAGNOSIS — O0943 Supervision of pregnancy with grand multiparity, third trimester: Secondary | ICD-10-CM | POA: Insufficient documentation

## 2024-03-09 DIAGNOSIS — O0990 Supervision of high risk pregnancy, unspecified, unspecified trimester: Secondary | ICD-10-CM

## 2024-03-09 DIAGNOSIS — Z603 Acculturation difficulty: Secondary | ICD-10-CM

## 2024-03-09 DIAGNOSIS — Z8632 Personal history of gestational diabetes: Secondary | ICD-10-CM

## 2024-03-09 DIAGNOSIS — Z758 Other problems related to medical facilities and other health care: Secondary | ICD-10-CM

## 2024-03-09 DIAGNOSIS — O0993 Supervision of high risk pregnancy, unspecified, third trimester: Secondary | ICD-10-CM

## 2024-03-09 NOTE — Progress Notes (Signed)
   PRENATAL VISIT NOTE  Subjective:  Destiny Cooper is a 43 y.o. G6P5005 at [redacted]w[redacted]d being seen today for ongoing prenatal care.  She is currently monitored for the following issues for this high-risk pregnancy and has AMA (advanced maternal age) multigravida 35+; Language barrier; Supervision of high risk pregnancy, antepartum; History of gestational diabetes; and Grand multiparity on their problem list.  Patient reports no complaints.  Contractions: Not present. Vag. Bleeding: None.  Movement: Present. Denies leaking of fluid.   The following portions of the patient's history were reviewed and updated as appropriate: allergies, current medications, past family history, past medical history, past social history, past surgical history and problem list.   Objective:    Vitals:   03/09/24 0832  BP: 99/66  Pulse: 91  Weight: 161 lb 3.2 oz (73.1 kg)    Fetal Status:  Fetal Heart Rate (bpm): 134   Movement: Present    General: Alert, oriented and cooperative. Patient is in no acute distress.  Skin: Skin is warm and dry. No rash noted.   Cardiovascular: Normal heart rate noted  Respiratory: Normal respiratory effort, no problems with respiration noted  Abdomen: Soft, gravid, appropriate for gestational age.  Pain/Pressure: Absent     Pelvic: Cervical exam deferred        Extremities: Normal range of motion.  Edema: None  Mental Status: Normal mood and affect. Normal behavior. Normal judgment and thought content.   Assessment and Plan:  Pregnancy: G6P5005 at [redacted]w[redacted]d  1. Supervision of high risk pregnancy, antepartum (Primary)  2. Multigravida of advanced maternal age in third trimester Has antental testing starting 34 weeks F/u US  today Reviewed recommendation for IOL no later than 40 weeks  3. History of gestational diabetes Passed 2 hr GTT  4. Language barrier Spanish translator needed  5. [redacted] weeks gestation of pregnancy   Preterm labor symptoms and general obstetric  precautions including but not limited to vaginal bleeding, contractions, leaking of fluid and fetal movement were reviewed in detail with the patient. Please refer to After Visit Summary for other counseling recommendations.   Return in about 2 weeks (around 03/23/2024) for high OB.  Future Appointments  Date Time Provider Department Center  03/09/2024  3:30 PM [facility] PROVIDER 1 [facility]  03/09/2024  3:45 PM [facility]MFC US5 [facility]  03/23/2024 11:15 AM Nicholaus Jorene FORBES DEVONNA [facility]  04/05/2024  1:15 PM Danny Geralds, DO [facility]  04/06/2024  1:15 PM [facility] PROVIDER 1 [facility]  04/06/2024  1:30 PM [facility] US5 [facility]  04/13/2024  1:15 PM [facility] PROVIDER 1 [facility]  04/13/2024  1:30 PM [facility]MFC US5 [facility]  04/15/2024  1:50 PM Celestia Rosaline SQUIBB, NP [facility]  04/19/2024 10:55 AM Eldonna Suzen Octave, MD [facility]  04/20/2024  1:15 PM W[facility]-M[facility] PROVIDER 1 [facility]  04/20/2024  1:30 PM W[facility]MFC US4 [facility]  04/27/2024 10:55 AM Claudene Virginia , CNM [facility]  05/05/2024  9:15 AM Nicholaus Jorene FORBES DEVONNA [facility]  05/10/2024 11:15 AM Fredirick Glenys RAMAN, MD [facility]  05/17/2024  9:55 AM Izell Harari, MD [facility]    Burnard CHRISTELLA Nicholaus, MD

## 2024-03-09 NOTE — Progress Notes (Signed)
 Maternal-Fetal Medicine Consultation  Name: Destiny Cooper  MRN: [id number]  GA: [id number] [redacted]w[redacted]d   Advanced maternal age (42 years) Patient returned for completion of fetal anatomy.  She does not have gestational diabetes.  Ultrasound Amniotic fluid is normal and good fetal activity seen.  Fetal growth is appropriate for gestational age.  Fetal spine could still not be evaluated because of fetal position.  Advanced maternal age (>40) The risk of stillbirth is slightly increased (2- to 3-fold), but the absolute risk is very small.  I discussed and recommended antenatal testing from [redacted] weeks gestation until delivery.  I recommended delivery at [redacted] weeks gestation.  Recommendations - An appointment was made for her to return in 4 weeks for fetal growth assessment and BPP. - Weekly antenatal testing from [redacted] weeks gestation until delivery. - Delivery at [redacted] weeks gestation.     Consultation including face-to-face (more than 50%) counseling 10 minutes.

## 2024-03-23 ENCOUNTER — Other Ambulatory Visit: Payer: Self-pay

## 2024-03-23 ENCOUNTER — Ambulatory Visit: Payer: Self-pay | Admitting: Physician Assistant

## 2024-03-23 VITALS — BP 100/68 | HR 82 | Wt 163.6 lb

## 2024-03-23 DIAGNOSIS — Z8632 Personal history of gestational diabetes: Secondary | ICD-10-CM

## 2024-03-23 DIAGNOSIS — O09523 Supervision of elderly multigravida, third trimester: Secondary | ICD-10-CM

## 2024-03-23 DIAGNOSIS — O0993 Supervision of high risk pregnancy, unspecified, third trimester: Secondary | ICD-10-CM

## 2024-03-23 DIAGNOSIS — O0990 Supervision of high risk pregnancy, unspecified, unspecified trimester: Secondary | ICD-10-CM

## 2024-03-23 DIAGNOSIS — Z3A32 32 weeks gestation of pregnancy: Secondary | ICD-10-CM

## 2024-03-23 NOTE — Progress Notes (Signed)
   PRENATAL VISIT NOTE  Subjective:  Destiny Cooper is a 43 y.o. G6P5005 at [redacted]w[redacted]d being seen today for ongoing prenatal care.  She is currently monitored for the following issues for this high-risk pregnancy and has AMA (advanced maternal age) multigravida 35+; Language barrier; Supervision of high risk pregnancy, antepartum; History of gestational diabetes; and Grand multiparity on their problem list.  Patient reports no complaints.  Contractions: Not present. Vag. Bleeding: None.  Movement: Present. Denies leaking of fluid.   The following portions of the patient's history were reviewed and updated as appropriate: allergies, current medications, past family history, past medical history, past social history, past surgical history and problem list.   Objective:    Vitals:   03/23/24 1151  BP: 100/68  Pulse: 82  Weight: 163 lb 9.6 oz (74.2 kg)    Fetal Status:  Fetal Heart Rate (bpm): 140 Fundal Height: 33 cm Movement: Present    General: Alert, oriented and cooperative. Patient is in no acute distress.  Skin: Skin is warm and dry. No rash noted.   Cardiovascular: Normal heart rate noted  Respiratory: Normal respiratory effort, no problems with respiration noted  Abdomen: Soft, gravid, appropriate for gestational age.  Pain/Pressure: Absent     Pelvic: Cervical exam deferred        Extremities: Normal range of motion.  Edema: None  Mental Status: Normal mood and affect. Normal behavior. Normal judgment and thought content.   Assessment and Plan:  Pregnancy: G6P5005 at [redacted]w[redacted]d  1. Supervision of high risk pregnancy, antepartum (Primary) Patient doing well, feeling regular fetal movement BP, FHR, FH appropriate   2. [redacted] weeks gestation of pregnancy Anticipatory guidance about next visits/weeks of pregnancy given.   3. History of gestational diabetes Normal 2 hr GTT  4. Multigravida of advanced maternal age in third trimester 03/09/24 EFW 1838 gm (79 %) Growth scan Q4 Weekly  antenatal testing   Delivery at 39 weeks   Preterm labor symptoms and general obstetric precautions including but not limited to vaginal bleeding, contractions, leaking of fluid and fetal movement were reviewed in detail with the patient.  Please refer to After Visit Summary for other counseling recommendations.   Return in about 2 weeks (around 04/06/2024) for [facility].  Future Appointments  Date Time Provider Department Center  04/05/2024  1:15 PM Danny Geralds, DO [facility]  04/06/2024  1:15 PM [facility] PROVIDER 1 [facility]  04/06/2024  1:30 PM [facility] US5 [facility]  04/13/2024  1:15 PM [facility] PROVIDER 1 [facility]  04/13/2024  1:30 PM [facility]MFC US5 [facility]  04/15/2024  1:50 PM Celestia Rosaline SQUIBB, NP [facility]  04/19/2024 10:55 AM Eldonna Suzen Octave, MD [facility]  04/20/2024  1:15 PM [facility] PROVIDER 1 [facility]  04/20/2024  1:30 PM [facility] US4 [facility]  04/27/2024  9:15 AM [facility] PROVIDER 1 [facility]  04/27/2024  9:30 AM W[facility]-M[facility] US5 [facility]  04/27/2024 10:55 AM Claudene Virginia , CNM [facility]  05/04/2024  9:15 AM [facility] PROVIDER 1 [facility]  05/04/2024  9:30 AM W[facility]MFC US5 [facility]  05/05/2024  9:15 AM Nicholaus Jorene FORBES DEVONNA [facility]  05/10/2024 11:15 AM Fredirick Glenys RAMAN, MD [facility]  05/17/2024  9:55 AM Izell Harari, MD [facility]    Jorene FORBES Nicholaus, PA-C

## 2024-04-05 ENCOUNTER — Other Ambulatory Visit: Payer: Self-pay

## 2024-04-05 ENCOUNTER — Ambulatory Visit: Payer: Self-pay

## 2024-04-05 VITALS — BP 109/72 | HR 89 | Wt 164.9 lb

## 2024-04-05 DIAGNOSIS — Z603 Acculturation difficulty: Secondary | ICD-10-CM

## 2024-04-05 DIAGNOSIS — Z758 Other problems related to medical facilities and other health care: Secondary | ICD-10-CM

## 2024-04-05 DIAGNOSIS — R252 Cramp and spasm: Secondary | ICD-10-CM

## 2024-04-05 DIAGNOSIS — O99891 Other specified diseases and conditions complicating pregnancy: Secondary | ICD-10-CM

## 2024-04-05 DIAGNOSIS — O09523 Supervision of elderly multigravida, third trimester: Secondary | ICD-10-CM

## 2024-04-05 DIAGNOSIS — Z641 Problems related to multiparity: Secondary | ICD-10-CM

## 2024-04-05 DIAGNOSIS — O0993 Supervision of high risk pregnancy, unspecified, third trimester: Secondary | ICD-10-CM

## 2024-04-05 DIAGNOSIS — Z3A34 34 weeks gestation of pregnancy: Secondary | ICD-10-CM

## 2024-04-05 DIAGNOSIS — O0990 Supervision of high risk pregnancy, unspecified, unspecified trimester: Secondary | ICD-10-CM

## 2024-04-05 NOTE — Progress Notes (Signed)
 PRENATAL VISIT NOTE  Subjective:  Destiny Cooper is a 43 y.o. G6P5005 at [redacted]w[redacted]d being seen today for ongoing prenatal care. She is currently monitored for the following issues for this high-risk pregnancy  Patient Active Problem List   Diagnosis Date Noted   Grand multiparity 01/20/2024   History of gestational diabetes 11/27/2023   Supervision of high risk pregnancy, antepartum 11/19/2023   Language barrier 07/28/2020   AMA (advanced maternal age) multigravida 35+ 05/29/2020    Patient reports leg cramping/burning RLE>LLE. No back or buttock discomfort. Contractions: Not present Vag. Bleeding: None Movement: Present Denies leaking of fluid.   4T planning Girl/combo/?  Wants a period each month but no more children Peds: same as other kids Car seat: not yet  Safe sleep: yes   The following portions of the patient's history were reviewed and updated as appropriate: allergies, current medications, past family history, past medical history, past social history, past surgical history and problem list.   Objective:   Vitals:   04/05/24 1334  BP: 109/72  Pulse: 89  Weight: 164 lb 14.4 oz (74.8 kg)    Fetal Status: Fetal Heart Rate (bpm): 134   Movement: Present     General:  Alert, oriented and cooperative. Patient is in no acute distress.  Skin: Skin is warm and dry. No rash noted.   Cardiovascular: Normal heart rate noted  Respiratory: Normal respiratory effort, no problems with respiration noted  Abdomen: Soft, gravid, 33cm appropriate for gestational age.  Pain/Pressure: Present     Pelvic: Cervical exam deferred        Extremities: Normal range of motion.  Edema: None  Mental Status: Normal mood and affect. Normal behavior. Normal judgment and thought content.   Assessment and Plan:  Destiny Cooper is 43 y.o. G6P5005 at [redacted]w[redacted]d, 05/14/2024, by Last Menstrual Period  1. Supervision of high risk pregnancy, antepartum (Primary)  2. [redacted] weeks gestation of  pregnancy  3. Grand multiparity  4. Multigravida of advanced maternal age in third trimester  5. Language barrier  6. Leg cramps in pregnancy FH, FHT, and bp WNL Girl/combo/?. Discussed pills and BTS Given info for Tdap and flu vaccines at [facility] GBS, GC/CT swabs next visit Safe meds in preg list provided, recommend Tums or magnesium lotion for RLE  Preterm labor symptoms and general obstetric precautions including but not limited to vaginal bleeding, contractions, leaking of fluid and fetal movement were reviewed in detail with the patient. Please refer to After Visit Summary for other counseling recommendations.   AMN Interpreter: Bette #[id number]   Future Appointments  Date Time Provider Department Center  04/06/2024  1:15 PM [facility] PROVIDER 1 [facility]  04/06/2024  1:30 PM [facility]MFC US5 [facility]  04/13/2024  1:15 PM [facility] PROVIDER 1 [facility]  04/13/2024  1:30 PM [facility] US5 [facility]US [facility]  04/15/2024  1:50 PM Celestia Rosaline SQUIBB, NP [facility]  04/19/2024 10:55 AM Eldonna Suzen Octave, MD [facility]  04/20/2024  1:15 PM [facility] PROVIDER 1 [facility]  04/20/2024  1:30 PM [facility]-MFC US4 [facility]  04/27/2024  9:15 AM [facility] PROVIDER 1 [facility]-M[facility]  04/27/2024  9:30 AM [facility]-MFC US5 [facility]  04/27/2024 10:55 AM Claudene, [place] , CNM [facility]  05/04/2024  9:15 AM [facility]-M[facility] PROVIDER 1 WMC-MFC [facility]  05/04/2024  9:30 AM [facility]-MFC US5 [facility]  05/05/2024  9:15 AM Nicholaus Jorene FORBES DEVONNA [facility]  05/10/2024 11:15 AM Fredirick Glenys RAMAN, MD [facility]  05/17/2024  9:55 AM  Izell Harari, MD [facility]

## 2024-04-05 NOTE — Patient Instructions (Signed)
 Ligadura de trompas con cesrea o despus del parto Con cesrea: $1170 Despus del parto: $1280 ms la anestesia La paciente debe informarle al personal de su deseo de hacerse la ligadura de trompas tan pronto  como le sea posible durante el [place]. Se requiere el pago por adelantado para que el procedimiento pueda programarse. Los planes de pago estn disponibles por solicitud. * La paciente debe llamar a [facility] Zandra) al [phone] para pedir la cotizacin.  Vasectoma en clnica, posible $8 pero yo no s exactamente      Las medicinas seguras para tomar durante el embarazo  Safe Medications in Pregnancy   Acn:  Benzoyl Peroxide (Perxido de benzolo)  Salicylic Acid (cido saliclico)   Dolor de espalda/Dolor de cabeza:  Tylenol  acetaminophen : 2 pastillas de concentracin regular 325mg  cada 4 horas O 2 pastillas de concentracin fuerte 500mg  cada 6 horas  NO ibuprofen  Motrin   Resfriados/Tos/Alergias:  Benadryl  (sin alcohol) 25 mg cada 6 horas segn lo necesite Breath Right strips (Tiras para respirar correctamente)  Claritin  Cepacol (pastillas de chupar para la garganta)  Chloraseptic (aerosol para la garganta)  Cold-Eeze- hasta tres veces por da  Cough drops (pastillas de chupar para la tos, sin alcohol)  Flonase (con receta mdica solamente)  Guaifenesin  Mucinex  Robitussin DM (simple solamente, sin alcohol)  Saline nasal spray/drops (Aerosol nasal salino/gotas) Sudafed (pseudoephedrine) y  Actifed * utilizar slo despus de 12 semanas de gestacin y si no tiene la presin arterial alta.  Tylenol  Vicks  VapoRub  Zinc lozenges (pastillas para la garganta)  Zyrtec    Estreimiento:  Colace  Ducolax (supositorios)  Fleet enema (lavado intestinal rectal)  Glycerin  (supositorios)  Metamucil  Milk of magnesia (leche de magnesia)  Miralax  Senokot  Smooth Move (t)   Diarrea:  Kaopectate Imodium A-D  *NO tome Pepto-Bismol   Hemorroides:  Anusol   Anusol HC  Preparation H  Tucks   Indigestin:  Tums  Maalox  Mylanta  Zantac  Pepcid   Insomnia:  Benadryl  (sin alcohol) 25mg  cada 6 horas segn lo necesite  Tylenol  PM  Unisom, no Gelcaps   Calambres en las piernas:  Tums  MagGel  Nuseas/Vmitos:  Bonine  Dramamine  Emetrol  Ginger (extracto)  Sea-Bands  Meclizine  Medicina para las nuseas que puede tomar durante el embarazo: Unisom (doxylamine succinate, pastillas de 25 mg) Tome una pastilla al da al [place]. Si los sntomas no estn adecuadamente controlados, la dosis puede aumentarse hasta una dosis mxima recomendada de [employer] al da (1/2 pastilla por la [place], 1/2 pastilla a media tarde y ignacia pastilla al [place]). Pastillas de Vitamina B6 de 100mg . Tome [employer] veces al da (hasta 200 mg por da).   Erupciones en la piel:  Productos de Aveeno  Benadryl  cream (crema o una dosis de 25mg  cada 6 horas segn lo necesite)  Calamine Lotion (locin)  1% cortisone cream (crema de cortisona de 1%)   nfeccin vaginal por hongos (candidiasis):  Gyne-lotrimin 7  Monistat 7   **Si est tomando varias medicinas, por favor revise las etiquetas para Art gallery manager los mismos ingredientes activos. **Tome la medicina segn lo indicado en la etiqueta. **No tome ms de 400 mg de Tylenol  en 24 horas. **No tome medicinas que contengan aspirina o ibuprofeno.

## 2024-04-06 ENCOUNTER — Ambulatory Visit: Payer: Self-pay | Attending: Obstetrics

## 2024-04-06 ENCOUNTER — Ambulatory Visit: Payer: Self-pay

## 2024-04-13 ENCOUNTER — Ambulatory Visit (HOSPITAL_BASED_OUTPATIENT_CLINIC_OR_DEPARTMENT_OTHER): Payer: Self-pay

## 2024-04-13 ENCOUNTER — Ambulatory Visit: Payer: Self-pay | Attending: Obstetrics | Admitting: Obstetrics

## 2024-04-13 DIAGNOSIS — Z3A36 36 weeks gestation of pregnancy: Secondary | ICD-10-CM | POA: Insufficient documentation

## 2024-04-13 DIAGNOSIS — O0990 Supervision of high risk pregnancy, unspecified, unspecified trimester: Secondary | ICD-10-CM

## 2024-04-13 DIAGNOSIS — O09523 Supervision of elderly multigravida, third trimester: Secondary | ICD-10-CM

## 2024-04-13 DIAGNOSIS — Z3A35 35 weeks gestation of pregnancy: Secondary | ICD-10-CM

## 2024-04-13 DIAGNOSIS — O09522 Supervision of elderly multigravida, second trimester: Secondary | ICD-10-CM

## 2024-04-13 DIAGNOSIS — Z362 Encounter for other antenatal screening follow-up: Secondary | ICD-10-CM | POA: Insufficient documentation

## 2024-04-13 DIAGNOSIS — O09293 Supervision of pregnancy with other poor reproductive or obstetric history, third trimester: Secondary | ICD-10-CM

## 2024-04-13 DIAGNOSIS — O0943 Supervision of pregnancy with grand multiparity, third trimester: Secondary | ICD-10-CM | POA: Insufficient documentation

## 2024-04-13 NOTE — Progress Notes (Signed)
 MFM Consult Note  Destiny Cooper is currently at 35 weeks and 4 days.  She has been followed due to advanced maternal age (43 years old) and grand multiparity.  She denies any problems since her last exam and has screened negative for gestational diabetes in her current pregnancy.  Sonographic findings Single intrauterine pregnancy. Fetal cardiac activity: Observed. Presentation: Cephalic. Fetal biometry shows the estimated fetal weight of 6 pounds 8 ounces which measures at the 75th percentile. Amniotic fluid: Within normal limits.  AFI 11.87 cm.  MVP: 3.33 cm. Placenta: Posterior. BPP: 8/8.   Due to advanced maternal age, we will continue to follow her with weekly fetal testing until delivery.  As she is over the age of 52, delivery should occur at around 39 weeks.    She will return in 1 week for another BPP.  The patient stated that all of her questions were answered today.    All conversations were held with the patient today with the help of a Spanish interpreter.  A total of 10 minutes was spent counseling and coordinating the care for this patient.  Greater than 50% of the time was spent in direct face-to-face contact.

## 2024-04-15 ENCOUNTER — Ambulatory Visit (INDEPENDENT_AMBULATORY_CARE_PROVIDER_SITE_OTHER): Payer: Self-pay | Admitting: Primary Care

## 2024-04-19 ENCOUNTER — Encounter: Payer: Self-pay | Admitting: Family Medicine

## 2024-04-20 ENCOUNTER — Ambulatory Visit: Payer: Self-pay | Attending: Maternal & Fetal Medicine | Admitting: Obstetrics

## 2024-04-20 ENCOUNTER — Other Ambulatory Visit: Payer: Self-pay | Admitting: *Deleted

## 2024-04-20 ENCOUNTER — Ambulatory Visit: Payer: Self-pay

## 2024-04-20 DIAGNOSIS — O09522 Supervision of elderly multigravida, second trimester: Secondary | ICD-10-CM

## 2024-04-20 DIAGNOSIS — O09523 Supervision of elderly multigravida, third trimester: Secondary | ICD-10-CM

## 2024-04-20 DIAGNOSIS — Z3A36 36 weeks gestation of pregnancy: Secondary | ICD-10-CM

## 2024-04-20 DIAGNOSIS — O0990 Supervision of high risk pregnancy, unspecified, unspecified trimester: Secondary | ICD-10-CM

## 2024-04-20 DIAGNOSIS — O09293 Supervision of pregnancy with other poor reproductive or obstetric history, third trimester: Secondary | ICD-10-CM

## 2024-04-20 DIAGNOSIS — O0943 Supervision of pregnancy with grand multiparity, third trimester: Secondary | ICD-10-CM

## 2024-04-20 DIAGNOSIS — Z362 Encounter for other antenatal screening follow-up: Secondary | ICD-10-CM | POA: Insufficient documentation

## 2024-04-20 NOTE — Progress Notes (Signed)
 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 G6P5005 at [redacted]w[redacted]d here for ultrasound and consultation. Destiny Cooper is doing well today with no acute concerns. Today we focused on the following:   The patient is here today for antenatal testing due to AMA over age 52.  She has no complaints.  She reports good fetal movement.  BPP is 8 out of 8.  She will continue antenatal testing until her due date.  Delivery should occur between 39 and 40 weeks.  The patient had time to ask questions that were answered to her satisfaction.  She verbalized understanding and agrees to proceed with the plan below.  Sonographic findings Single intrauterine pregnancy. Fetal cardiac activity: Observed. Presentation: Cephalic. Interval fetal anatomy appears normal. Amniotic fluid: Within normal limits.  MVP: 5.09 cm. Placenta: Posterior. BPP: 8/8.   There are limitations of prenatal ultrasound such as the inability to detect certain abnormalities due to poor visualization. Various factors such as fetal position, gestational age and maternal body habitus may increase the difficulty in visualizing the fetal anatomy.    Recommendations -Continue antenatal testing until her due date.  Delivery should occur between 39 and 40 weeks.  An in person interpreter was used in the patient's language of preference for today's visit.   Review of Systems: A review of systems was performed and was negative except per HPI   Vitals and Physical  Exam    04/13/2024    1:09 PM 04/05/2024    1:34 PM 03/23/2024   11:51 AM  Vitals with BMI  Weight  164 lbs 14 oz 163 lbs 10 oz  Systolic 116 109 899  Diastolic 66 72 68  Pulse 80 89 82    Sitting comfortably on the sonogram table Nonlabored breathing Normal rate and rhythm Abdomen is nontender  Past pregnancies OB History  Gravida Para Term Preterm AB Living  6 5 5   5   SAB IAB Ectopic Multiple Live Births     0 5    # Outcome Date GA Lbr Len/2nd Weight Sex Type Anes PTL Lv  6 Current           5 Term 09/24/20 [redacted]w[redacted]d / 00:02 7 lb 6.2 oz (3.35 kg) M Vag-Spont None  LIV     Birth Comments: no anomalies, GDM  4 Term 06/13/14 [redacted]w[redacted]d  8 lb 9.4 oz (3.895 kg) F Vag-Spont None  LIV     Birth Comments: delivered at home, then admitted  3 Term 04/14/12 [redacted]w[redacted]d 02:02 / 00:06 8 lb 10.5 oz (3.925 kg) F Vag-Spont None  LIV     Birth Comments: none  2 Term 05/10/06 [redacted]w[redacted]d  7 lb (3.175 kg)  Vag-Spont None  LIV     Birth Comments: wnl  1 Term 07/30/01 [redacted]w[redacted]d  7 lb (3.175 kg)  Vag-Spont None  LIV     Birth Comments: wnl     I spent 10 minutes reviewing the patients chart, including labs and images as well as counseling the patient about her  medical conditions. Greater than 50% of the time was spent in direct face-to-face patient counseling.  Destiny Cooper  MFM, [place]   04/20/2024  1:44 PM

## 2024-04-27 ENCOUNTER — Other Ambulatory Visit: Payer: Self-pay

## 2024-04-27 ENCOUNTER — Ambulatory Visit: Payer: Self-pay | Admitting: Advanced Practice Midwife

## 2024-04-27 ENCOUNTER — Ambulatory Visit: Payer: Self-pay

## 2024-04-27 ENCOUNTER — Other Ambulatory Visit (HOSPITAL_COMMUNITY)
Admission: RE | Admit: 2024-04-27 | Discharge: 2024-04-27 | Disposition: A | Discharge status: Home / Self Care | Payer: Self-pay | Source: Ambulatory Visit | Attending: Advanced Practice Midwife | Admitting: Advanced Practice Midwife

## 2024-04-27 VITALS — BP 122/80 | HR 76 | Wt 166.0 lb

## 2024-04-27 DIAGNOSIS — Z758 Other problems related to medical facilities and other health care: Secondary | ICD-10-CM

## 2024-04-27 DIAGNOSIS — O0993 Supervision of high risk pregnancy, unspecified, third trimester: Secondary | ICD-10-CM

## 2024-04-27 DIAGNOSIS — O0990 Supervision of high risk pregnancy, unspecified, unspecified trimester: Secondary | ICD-10-CM | POA: Insufficient documentation

## 2024-04-27 DIAGNOSIS — Z3A37 37 weeks gestation of pregnancy: Secondary | ICD-10-CM

## 2024-04-27 DIAGNOSIS — O09523 Supervision of elderly multigravida, third trimester: Secondary | ICD-10-CM

## 2024-04-27 DIAGNOSIS — Z603 Acculturation difficulty: Secondary | ICD-10-CM

## 2024-04-27 NOTE — Progress Notes (Signed)
   PRENATAL VISIT NOTE  Subjective:  Destiny Cooper is a 43 y.o. G6P5005 at [redacted]w[redacted]d being seen today for ongoing prenatal care.  She is currently monitored for the following issues for this high-risk pregnancy and has AMA (advanced maternal age) multigravida 35+; Language barrier; Supervision of high risk pregnancy, antepartum; History of gestational diabetes; and Grand multiparity on their problem list.   Patient reports no complaints.  Contractions: Irritability. Vag. Bleeding: None.  Movement: Present. Denies leaking of fluid.   The following portions of the patient's history were reviewed and updated as appropriate: allergies, current medications, past family history, past medical history, past social history, past surgical history and problem list.   Objective:   Vitals:   04/27/24 1118  BP: 122/80  Pulse: 76  Weight: 166 lb (75.3 kg)    Fetal Status: Fetal Heart Rate (bpm): 141 Fundal Height: 37 cm Movement: Present  Presentation: Vertex  General:  Alert, oriented and cooperative. Patient is in no acute distress.  Skin: Skin is warm and dry. No rash noted.   Cardiovascular: Normal heart rate noted  Respiratory: Normal respiratory effort, no problems with respiration noted  Abdomen: Soft, gravid, appropriate for gestational age.  Pain/Pressure: Present     Pelvic: Cervical exam performed in the presence of a chaperone Dilation: Closed Effacement (%): 0    Extremities: Normal range of motion.  Edema: Trace  Mental Status: Normal mood and affect. Normal behavior. Normal judgment and thought content.   Assessment and Plan:  Pregnancy: G6P5005 at [redacted]w[redacted]d 1. Multigravida of advanced maternal age in third trimester (Primary) - Antenatal testing per MFM  2. Language barrier - Video interpreter used.   3. Supervision of high risk pregnancy, antepartum - Culture, beta strep (group b only) - GC/Chlamydia probe amp ([place])not at [facility]  4. [redacted] weeks gestation of pregnancy  Term  labor symptoms and general obstetric precautions including but not limited to vaginal bleeding, contractions, leaking of fluid and fetal movement were reviewed in detail with the patient. Please refer to After Visit Summary for other counseling recommendations.   No follow-ups on file.  Future Appointments  Date Time Provider Department Center  05/04/2024  3:15 PM [facility] NST [facility]  05/05/2024  9:15 AM Nicholaus Jorene FORBES DEVONNA [facility]  05/10/2024 11:15 AM Fredirick Glenys RAMAN, MD [facility]  05/17/2024  9:55 AM Izell Harari, MD [facility]    Damaya Channing  Claudene, CNM [facility]

## 2024-04-28 ENCOUNTER — Other Ambulatory Visit: Payer: Self-pay

## 2024-04-28 DIAGNOSIS — O0993 Supervision of high risk pregnancy, unspecified, third trimester: Secondary | ICD-10-CM

## 2024-04-28 DIAGNOSIS — Z3A37 37 weeks gestation of pregnancy: Secondary | ICD-10-CM

## 2024-04-28 DIAGNOSIS — O0990 Supervision of high risk pregnancy, unspecified, unspecified trimester: Secondary | ICD-10-CM

## 2024-04-28 LAB — GC/CHLAMYDIA PROBE AMP (~~LOC~~) NOT AT ARMC
Chlamydia: NEGATIVE
Comment: NEGATIVE
Comment: NORMAL
Neisseria Gonorrhea: NEGATIVE

## 2024-04-29 ENCOUNTER — Other Ambulatory Visit: Payer: Self-pay

## 2024-04-29 ENCOUNTER — Telehealth: Payer: Self-pay | Admitting: *Deleted

## 2024-04-29 ENCOUNTER — Ambulatory Visit: Payer: Self-pay

## 2024-04-29 DIAGNOSIS — Z641 Problems related to multiparity: Secondary | ICD-10-CM

## 2024-04-29 DIAGNOSIS — O09529 Supervision of elderly multigravida, unspecified trimester: Secondary | ICD-10-CM

## 2024-04-29 DIAGNOSIS — Z3A37 37 weeks gestation of pregnancy: Secondary | ICD-10-CM

## 2024-04-29 DIAGNOSIS — O0990 Supervision of high risk pregnancy, unspecified, unspecified trimester: Secondary | ICD-10-CM

## 2024-04-29 DIAGNOSIS — O09523 Supervision of elderly multigravida, third trimester: Secondary | ICD-10-CM

## 2024-04-29 DIAGNOSIS — O0993 Supervision of high risk pregnancy, unspecified, third trimester: Secondary | ICD-10-CM

## 2024-04-29 NOTE — Telephone Encounter (Signed)
 I called Destiny Cooper with interpreter [employer] and informed her we would like her to come back today for another BPP and may need NST depending on BPP results. She agreed to 4:15 appointment. Rock Skip PEAK

## 2024-04-30 LAB — CULTURE, BETA STREP (GROUP B ONLY): Strep Gp B Culture: POSITIVE — AB

## 2024-05-04 ENCOUNTER — Ambulatory Visit: Payer: Self-pay | Admitting: Advanced Practice Midwife

## 2024-05-04 ENCOUNTER — Ambulatory Visit: Payer: Self-pay | Attending: Obstetrics and Gynecology

## 2024-05-04 ENCOUNTER — Ambulatory Visit: Payer: Self-pay

## 2024-05-04 DIAGNOSIS — Z3A38 38 weeks gestation of pregnancy: Secondary | ICD-10-CM | POA: Insufficient documentation

## 2024-05-04 DIAGNOSIS — O0990 Supervision of high risk pregnancy, unspecified, unspecified trimester: Secondary | ICD-10-CM

## 2024-05-04 DIAGNOSIS — Z641 Problems related to multiparity: Secondary | ICD-10-CM | POA: Insufficient documentation

## 2024-05-04 DIAGNOSIS — O09523 Supervision of elderly multigravida, third trimester: Secondary | ICD-10-CM | POA: Insufficient documentation

## 2024-05-04 DIAGNOSIS — O0943 Supervision of pregnancy with grand multiparity, third trimester: Secondary | ICD-10-CM

## 2024-05-04 DIAGNOSIS — Z3689 Encounter for other specified antenatal screening: Secondary | ICD-10-CM | POA: Insufficient documentation

## 2024-05-04 DIAGNOSIS — O99820 Streptococcus B carrier state complicating pregnancy: Secondary | ICD-10-CM | POA: Insufficient documentation

## 2024-05-04 NOTE — Procedures (Signed)
 Destiny Cooper Feb 16, 1981 [redacted]w[redacted]d  Fetus A Non-Stress Test Interpretation for 05/04/24  Indication: Advanced Maternal Age >40 years and Grand Multiparity - NST only  Fetal Heart Rate A Mode: External Baseline Rate (A): 145 bpm Variability: Moderate Accelerations: 15 x 15 Decelerations: None Multiple birth?: No  Uterine Activity Mode: Palpation, Toco Contraction Frequency (min): none noted Resting Tone Palpated: Relaxed  Interpretation (Fetal Testing) Nonstress Test Interpretation: Reactive Comments: Reviewed with Dr. Ileana

## 2024-05-05 ENCOUNTER — Ambulatory Visit (INDEPENDENT_AMBULATORY_CARE_PROVIDER_SITE_OTHER): Payer: Self-pay | Admitting: Physician Assistant

## 2024-05-05 ENCOUNTER — Other Ambulatory Visit: Payer: Self-pay

## 2024-05-05 VITALS — BP 119/78 | HR 90 | Wt 166.0 lb

## 2024-05-05 DIAGNOSIS — Z758 Other problems related to medical facilities and other health care: Secondary | ICD-10-CM

## 2024-05-05 DIAGNOSIS — Z3A38 38 weeks gestation of pregnancy: Secondary | ICD-10-CM

## 2024-05-05 DIAGNOSIS — O99820 Streptococcus B carrier state complicating pregnancy: Secondary | ICD-10-CM

## 2024-05-05 DIAGNOSIS — O0990 Supervision of high risk pregnancy, unspecified, unspecified trimester: Secondary | ICD-10-CM

## 2024-05-05 DIAGNOSIS — O09523 Supervision of elderly multigravida, third trimester: Secondary | ICD-10-CM

## 2024-05-05 DIAGNOSIS — Z8632 Personal history of gestational diabetes: Secondary | ICD-10-CM

## 2024-05-05 DIAGNOSIS — O0993 Supervision of high risk pregnancy, unspecified, third trimester: Secondary | ICD-10-CM

## 2024-05-05 DIAGNOSIS — Z603 Acculturation difficulty: Secondary | ICD-10-CM

## 2024-05-05 NOTE — Progress Notes (Addendum)
 PRENATAL VISIT NOTE  Subjective:  Destiny Cooper is a 43 y.o. G6P5005 at [redacted]w[redacted]d being seen today for ongoing prenatal care.  She is currently monitored for the following issues for this high-risk pregnancy and has AMA (advanced maternal age) multigravida 35+; Language barrier; Supervision of high risk pregnancy, antepartum; History of gestational diabetes; Grand multiparity; and Group B Streptococcus carrier, antepartum on their problem list.  Patient reports no complaints.  Contractions: Not present. Vag. Bleeding: None.  Movement: Present. Denies leaking of fluid.   The following portions of the patient's history were reviewed and updated as appropriate: allergies, current medications, past family history, past medical history, past social history, past surgical history and problem list.   Objective:   Vitals:   05/05/24 0934  BP: 119/78  Pulse: 90  Weight: 166 lb (75.3 kg)    Fetal Status:  Fetal Heart Rate (bpm): 141 Fundal Height: 37 cm Movement: Present    General: Alert, oriented and cooperative. Patient is in no acute distress.  Skin: Skin is warm and dry. No rash noted.   Cardiovascular: Normal heart rate noted  Respiratory: Normal respiratory effort, no problems with respiration noted  Abdomen: Soft, gravid, appropriate for gestational age.  Pain/Pressure: Absent     Pelvic: Cervical exam deferred        Extremities: Normal range of motion.  Edema: None  Mental Status: Normal mood and affect. Normal behavior. Normal judgment and thought content.      11/27/2023   11:02 AM 08/11/2020    9:38 AM 07/28/2020    9:15 AM  Depression screen PHQ 2/9  Decreased Interest 0 0 0  Down, Depressed, Hopeless 0 0 0  PHQ - 2 Score 0 0 0  Altered sleeping 0 0 0  Tired, decreased energy 1 0 0  Change in appetite 0 0 0  Feeling bad or failure about yourself  0 0 0  Trouble concentrating 0 0 0  Moving slowly or fidgety/restless 0 0 0  Suicidal thoughts 0 0 0  PHQ-9 Score 1  0  0       Data saved with a previous flowsheet row definition        11/27/2023   11:02 AM 08/11/2020    9:39 AM 07/28/2020    9:15 AM 05/29/2020    8:50 AM  GAD 7 : Generalized Anxiety Score  Nervous, Anxious, on Edge 0 0 0 0  Control/stop worrying 0 0 0 0  Worry too much - different things 0 0 0 0  Trouble relaxing 0 0 0 0  Restless 0 0 0 0  Easily annoyed or irritable 0 0 0 0  Afraid - awful might happen 0 0 0 0  Total GAD 7 Score 0 0 0 0    Assessment and Plan:  Pregnancy: G6P5005 at [redacted]w[redacted]d  1. Supervision of high risk pregnancy, antepartum (Primary) Patient is doing well, feeling regular fetal movement BP, FHR, FH appropriate   2. [redacted] weeks gestation of pregnancy Anticipatory guidance about next visits/weeks of pregnancy given.   3. History of gestational diabetes Normal 2 hr GTT  4. Multigravida of advanced maternal age in third trimester Continue ASA Continue weekly antenatal testing Delivery at 39 weeks, IOL scheduled 11/18, admission orders placed  5. Group B Streptococcus carrier, antepartum Intrapartum treatment  6. Language barrier Spanish interpreter utilized during encounter   Term labor symptoms and general obstetric precautions including but not limited to vaginal bleeding, contractions, leaking of fluid and fetal movement  were reviewed in detail with the patient.  Please refer to After Visit Summary for other counseling recommendations.   No follow-ups on file.  Future Appointments  Date Time Provider Department Center  05/10/2024 11:15 AM Fredirick Glenys RAMAN, MD [facility]  05/11/2024  4:00 PM [facility] NST [facility]  05/17/2024  9:55 AM Izell Harari, MD [facility]    Jorene FORBES Moats, PA-C

## 2024-05-06 ENCOUNTER — Telehealth (HOSPITAL_COMMUNITY): Payer: Self-pay | Admitting: *Deleted

## 2024-05-06 NOTE — Telephone Encounter (Signed)
 [phone] interpreter number

## 2024-05-08 ENCOUNTER — Other Ambulatory Visit: Payer: Self-pay

## 2024-05-08 ENCOUNTER — Inpatient Hospital Stay (HOSPITAL_COMMUNITY)
Admission: AD | Admit: 2024-05-08 | Discharge: 2024-05-10 | DRG: 807 | Disposition: A | Discharge status: Home / Self Care | Payer: MEDICAID | Attending: Obstetrics & Gynecology | Admitting: Obstetrics & Gynecology

## 2024-05-08 ENCOUNTER — Encounter (HOSPITAL_COMMUNITY): Payer: Self-pay | Admitting: Obstetrics & Gynecology

## 2024-05-08 DIAGNOSIS — Z641 Problems related to multiparity: Secondary | ICD-10-CM

## 2024-05-08 DIAGNOSIS — Z603 Acculturation difficulty: Secondary | ICD-10-CM | POA: Diagnosis present

## 2024-05-08 DIAGNOSIS — B951 Streptococcus, group B, as the cause of diseases classified elsewhere: Secondary | ICD-10-CM | POA: Insufficient documentation

## 2024-05-08 DIAGNOSIS — O26893 Other specified pregnancy related conditions, third trimester: Secondary | ICD-10-CM | POA: Diagnosis present

## 2024-05-08 DIAGNOSIS — O0990 Supervision of high risk pregnancy, unspecified, unspecified trimester: Secondary | ICD-10-CM

## 2024-05-08 DIAGNOSIS — Z3A39 39 weeks gestation of pregnancy: Secondary | ICD-10-CM

## 2024-05-08 DIAGNOSIS — Z6791 Unspecified blood type, Rh negative: Secondary | ICD-10-CM | POA: Diagnosis not present

## 2024-05-08 DIAGNOSIS — Z7982 Long term (current) use of aspirin: Secondary | ICD-10-CM | POA: Diagnosis not present

## 2024-05-08 DIAGNOSIS — O99824 Streptococcus B carrier state complicating childbirth: Secondary | ICD-10-CM | POA: Diagnosis present

## 2024-05-08 DIAGNOSIS — O09529 Supervision of elderly multigravida, unspecified trimester: Secondary | ICD-10-CM

## 2024-05-08 DIAGNOSIS — O99820 Streptococcus B carrier state complicating pregnancy: Secondary | ICD-10-CM

## 2024-05-08 LAB — CBC
HCT: 37.4 % (ref 36.0–46.0)
Hemoglobin: 12.7 g/dL (ref 12.0–15.0)
MCH: 28.9 pg (ref 26.0–34.0)
MCHC: 34 g/dL (ref 30.0–36.0)
MCV: 85 fL (ref 80.0–100.0)
Platelets: 224 K/uL (ref 150–400)
RBC: 4.4 MIL/uL (ref 3.87–5.11)
RDW: 14.1 % (ref 11.5–15.5)
WBC: 7.1 K/uL (ref 4.0–10.5)
nRBC: 0 % (ref 0.0–0.2)

## 2024-05-08 LAB — TYPE AND SCREEN
ABO/RH(D): O POS
Antibody Screen: NEGATIVE

## 2024-05-08 MED ORDER — OXYTOCIN-SODIUM CHLORIDE 30-0.9 UT/500ML-% IV SOLN
INTRAVENOUS | Status: AC
  Filled 2024-05-08: qty 500

## 2024-05-08 MED ORDER — SENNOSIDES-DOCUSATE SODIUM 8.6-50 MG PO TABS
2.0000 | ORAL_TABLET | Freq: Every day | ORAL | Status: DC
  Administered 2024-05-09 – 2024-05-10 (×2): 2 via ORAL
  Filled 2024-05-08 (×2): qty 2

## 2024-05-08 MED ORDER — BENZOCAINE-MENTHOL 20-0.5 % EX AERO
1.0000 | INHALATION_SPRAY | CUTANEOUS | Status: DC | PRN
  Filled 2024-05-08: qty 56

## 2024-05-08 MED ORDER — IBUPROFEN 600 MG PO TABS
600.0000 mg | ORAL_TABLET | Freq: Four times a day (QID) | ORAL | Status: DC
  Administered 2024-05-08 – 2024-05-10 (×7): 600 mg via ORAL
  Filled 2024-05-08 (×7): qty 1

## 2024-05-08 MED ORDER — ONDANSETRON HCL 4 MG/2ML IJ SOLN: 4.0000 mg | INTRAMUSCULAR | Status: DC | PRN

## 2024-05-08 MED ORDER — DIPHENHYDRAMINE HCL 25 MG PO CAPS: 25.0000 mg | ORAL_CAPSULE | Freq: Four times a day (QID) | ORAL | Status: DC | PRN

## 2024-05-08 MED ORDER — COCONUT OIL OIL: 1.0000 | TOPICAL_OIL | Status: DC | PRN

## 2024-05-08 MED ORDER — DIBUCAINE (PERIANAL) 1 % EX OINT: 1.0000 | TOPICAL_OINTMENT | CUTANEOUS | Status: DC | PRN

## 2024-05-08 MED ORDER — WITCH HAZEL-GLYCERIN EX PADS: 1.0000 | MEDICATED_PAD | CUTANEOUS | Status: DC | PRN

## 2024-05-08 MED ORDER — PRENATAL MULTIVITAMIN CH
1.0000 | ORAL_TABLET | Freq: Every day | ORAL | Status: DC
  Administered 2024-05-09 – 2024-05-10 (×2): 1 via ORAL
  Filled 2024-05-08 (×2): qty 1

## 2024-05-08 MED ORDER — ACETAMINOPHEN 325 MG PO TABS: 650.0000 mg | ORAL_TABLET | ORAL | Status: DC | PRN

## 2024-05-08 MED ORDER — SIMETHICONE 80 MG PO CHEW: 80.0000 mg | CHEWABLE_TABLET | ORAL | Status: DC | PRN

## 2024-05-08 MED ORDER — FENTANYL CITRATE (PF) 100 MCG/2ML IJ SOLN
INTRAMUSCULAR | Status: AC
  Filled 2024-05-08: qty 2

## 2024-05-08 MED ORDER — ONDANSETRON HCL 4 MG PO TABS: 4.0000 mg | ORAL_TABLET | ORAL | Status: DC | PRN

## 2024-05-08 MED ORDER — TETANUS-DIPHTH-ACELL PERTUSSIS 5-2-15.5 LF-MCG/0.5 IM SUSP: 0.5000 mL | Freq: Once | INTRAMUSCULAR | Status: DC

## 2024-05-08 MED ORDER — ZOLPIDEM TARTRATE 5 MG PO TABS: 5.0000 mg | ORAL_TABLET | Freq: Every evening | ORAL | Status: DC | PRN

## 2024-05-08 NOTE — H&P (Signed)
 OBSTETRIC ADMISSION HISTORY AND PHYSICAL  Destiny Cooper is 43 y.o. H3E4994 with IUP at [redacted]w[redacted]d 05/14/2024, by Last Menstrual Period presenting for SOL. She received her prenatal care at [facility]  ROS (+) FM, ctx (-) VB, LOF. HA, visual changes, CP, SOB, RUQ pain, peripheral edema.   Prenatal History/Complications NURSING  PROVIDER  [employer], [facility] Dating by LMP  [facility] Model Traditional Anatomy U/S normal  Initiated care at  14wks                 Language  Spanish               LAB RESULTS   Support Person   Genetics NIPS: declined AFP: normal      NT/IT (FT only)        Carrier Screen Horizon: declined  Rhogam  O/Positive/-- (05/28 0917) A1C/GTT Early HgbA1C: 5.6 Third trimester 2 hr GTT: 69/175/141  Flu Vaccine Declined 03/23/24      TDaP Vaccine Declined 03/23/24 Blood Type O/Positive/-- (05/28 0917)  RSV Vaccine   Antibody Negative (05/28 0917)  COVID Vaccine   Rubella 8.17 (05/28 0917)  Feeding Plan Both RPR Non Reactive (05/28 9082)  Contraception Abstinence - injection HBsAg Negative (05/28 0917)  Circumcision girl HIV Non Reactive (05/28 0917)  Pediatrician   [facility] [address] HCVAb Non Reactive (05/28 0917)  Prenatal Classes        BTL Consent NA Pap No results found for: DIAGPAP  BTL Pre-payment NA GC/CT Initial:   36wks:    VBAC Consent NA GBS Positive/-- (11/04 1300) For PCN allergy, check sensitivities   BRx Optimized? [ ]  yes   [ ]  no      DME Rx [ ]  BP cuff [ ]  Weight Scale Waterbirth  [ ]  Class [ ]  Consent [ ]  CNM visit  PHQ9 & GAD7 [  ] new OB [  ] 59 weeks  [  ] 36 weeks Induction  [ ]  Orders Entered [ ] Foley Y/N   OB History  Gravida Para Term Preterm AB Living  6 5 5   5   SAB IAB Ectopic Multiple Live Births     0 5    # Outcome Date GA Lbr Len/2nd Weight Sex Type Anes PTL Lv  6 Current           5 Term 09/24/20 [redacted]w[redacted]d / 00:02 3350 g M Vag-Spont None  LIV     Birth Comments: no anomalies, GDM  4 Term 06/13/14 [redacted]w[redacted]d  3895 g F  Vag-Spont None  LIV     Birth Comments: delivered at home, then admitted  3 Term 04/14/12 [redacted]w[redacted]d 02:02 / 00:06 3925 g F Vag-Spont None  LIV     Birth Comments: none  2 Term 05/10/06 [redacted]w[redacted]d  3175 g  Vag-Spont None  LIV     Birth Comments: wnl  1 Term 07/30/01 [redacted]w[redacted]d  3175 g  Vag-Spont None  LIV     Birth Comments: wnl   Patient Active Problem List   Diagnosis Date Noted   Labor and delivery, indication for care 05/08/2024   Group B Streptococcus carrier, antepartum 05/04/2024   Grand multiparity 01/20/2024   History of gestational diabetes 11/27/2023   Supervision of high risk pregnancy, antepartum 11/19/2023   Language barrier 07/28/2020   AMA (advanced maternal age) multigravida 35+ 05/29/2020    Past Medical History: Past Medical History:  Diagnosis Date   Abnormal Pap smear    Active labor at term 04/14/2012  GDM (gestational diabetes mellitus)    History of UTI    Murmur    Postpartum care following vaginal delivery 06/15/2014   Vaginal delivery 04/14/2012   Vaginal Pap smear, abnormal     Past Surgical History: Past Surgical History:  Procedure Laterality Date   NO PAST SURGERIES      Social History Social History   Socioeconomic History   Marital status: Married    Spouse name: Not on file   Number of children: Not on file   Years of education: Not on file   Highest education level: Not on file  Occupational History   Not on file  Tobacco Use   Smoking status: Never   Smokeless tobacco: Never  Vaping Use   Vaping status: Never Used  Substance and Sexual Activity   Alcohol use: Not Currently    Comment: rare   Drug use: No   Sexual activity: Yes    Birth control/protection: None  Other Topics Concern   Not on file  Social History Narrative   Not on file   Social Drivers of Health   Financial Resource Strain: Medium Risk (05/22/2021)   Received from [facility]   Overall Financial Resource Strain (CARDIA)    Difficulty of Paying Living  Expenses: Somewhat hard  Food Insecurity: No Food Insecurity (11/27/2023)   Hunger Vital Sign    Worried About Running Out of Food in the Last Year: Never true    Ran Out of Food in the Last Year: Never true  Transportation Needs: No Transportation Needs (11/27/2023)   PRAPARE - Administrator, Civil Service (Medical): No    Lack of Transportation (Non-Medical): No  Physical Activity: Not on file  Stress: Not on file  Social Connections: Not on file    Family History: Family History  Problem Relation Age of Onset   Cancer Neg Hx    Diabetes Neg Hx    Heart disease Neg Hx    Hypertension Neg Hx     Allergies: No Known Allergies  Medications Prior to Admission  Medication Sig Dispense Refill Last Dose/Taking   aspirin  81 MG chewable tablet Chew 1 tablet (81 mg total) by mouth daily. 90 tablet 3 05/07/2024   Prenatal Vit w/Fe-Methylfol-FA (PNV PO) Take by mouth.   05/07/2024     Review of Systems  All systems reviewed and negative except as stated in HPI  PHYSICAL EXAM Blood pressure 132/74, pulse 81, temperature 98.1 F (36.7 C), temperature source Oral, resp. rate 17, height 5' 2 (1.575 m), weight 75.4 kg, last menstrual period 08/08/2023, SpO2 99%. General appearance: no acute distress Lungs: RR and SpO2 WNL Heart: regular rate Abdomen: gravid  Fetal monitoringBaseline: 150 bpm, Variability: Good {> 6 bpm), Accelerations: Reactive, and Decelerations: Early Uterine activityFrequency: Every 2-4 minutes  Dilation: 5.5 Effacement (%): 80 Station: -2 Exam by:: J.Bellamy, RN Presentation: cephalic  Prenatal labs: ABO, Rh: O/Positive/-- (05/28 9082) Antibody: Negative (05/28 0917) Rubella: 8.17 (05/28 0917) RPR: Non Reactive (09/02 0936)  HBsAg: Negative (05/28 0917)  HIV: Non Reactive (09/02 0936)   Lab Results  Component Value Date   GBS Positive (A) 04/27/2024    Anatomy US : normal anatomy with marginal cord insertion  Immunization History   Administered Date(s) Administered   Influenza Inj Mdck Quad Pf 05/22/2021   Influenza, Mdck, Trivalent,PF 6+ MOS(egg free) 03/11/2023    Prenatal Transfer Tool  Maternal Diabetes: No Genetic Screening: Declined Maternal Ultrasounds/Referrals: Normal Fetal Ultrasounds or other Referrals:  None Maternal Substance Abuse:  No Significant Maternal Medications:  None Significant Maternal Lab Results: Group B Strep positive and Rh negative Number of Prenatal Visits:greater than 3 verified prenatal visits Maternal Vaccinations: none Other Comments:  None   No results found for this or any previous visit (from the past 24 hours).  Patient Active Problem List   Diagnosis Date Noted   Labor and delivery, indication for care 05/08/2024   Group B Streptococcus carrier, antepartum 05/04/2024   Grand multiparity 01/20/2024   History of gestational diabetes 11/27/2023   Supervision of high risk pregnancy, antepartum 11/19/2023   Language barrier 07/28/2020   AMA (advanced maternal age) multigravida 35+ 05/29/2020    ASSESSMENT & PLAN Destiny Cooper is 43 y.o. H3E4994 with IUP at [redacted]w[redacted]d 05/14/2024, by Last Menstrual Period admitted for SOL and ultimately delivered precipitously in the MAU.  Sono at 35+4: normal anatomy, cephalic presentation, posterior placenta, EFW 2950g (75%)  Grandmultiparity - caution PPH  AMA - declined genetic testing - weekly antenatal testing done  #Labor: SOL, augment PRN #Pain: Per patient preference, encourage ambulation #FWB: Cat I  #GBS status:  positive #Feeding: Breastmilk  and Formula #Reproductive Life planning: Undecided #Circ:  not applicable   Destiny Maier, DO [facility] [employer]

## 2024-05-08 NOTE — Discharge Summary (Signed)
 Postpartum Discharge Summary  Date of Service updated***     Patient Name: Destiny Cooper DOB: Nov 26, 1980 MRN: [id number]  Date of admission: 05/08/2024 Delivery date:05/08/2024 Delivering provider: PAYNE, SHANTONETTE M Date of discharge: 05/08/2024  Admitting diagnosis: Advanced maternal age in multigravida [O09.529] Intrauterine pregnancy: [redacted]w[redacted]d     Secondary diagnosis:  Principal Problem:   Labor and delivery, indication for care Active Problems:   AMA (advanced maternal age) multigravida 35+   Language barrier   Grand multiparity   Group B Streptococcus carrier, antepartum   Advanced maternal age in multigravida   Precipitous delivery   Positive GBS test  Additional problems:  Patient Active Problem List   Diagnosis Date Noted   Labor and delivery, indication for care 05/08/2024   Advanced maternal age in multigravida 05/08/2024   Precipitous delivery 05/08/2024   Positive GBS test 05/08/2024   Group B Streptococcus carrier, antepartum 05/04/2024   Grand multiparity 01/20/2024   History of gestational diabetes 11/27/2023   Supervision of high risk pregnancy, antepartum 11/19/2023   Language barrier 07/28/2020   AMA (advanced maternal age) multigravida 35+ 05/29/2020       Discharge diagnosis: Term Pregnancy Delivered                                              Post partum procedures:{Postpartum procedures:23558} Augmentation: N/A Complications: None  Hospital course: Onset of Labor With Vaginal Delivery      43 y.o. yo H3E3993 at [redacted]w[redacted]d was admitted in Active Labor on 05/08/2024. Labor course was complicated by GBS positive without treatment   Membrane Rupture Time/Date: 4:10 PM,05/08/2024  Delivery Method:Vaginal, Spontaneous Operative Delivery:N/A Episiotomy: None Lacerations:  None Patient had a postpartum course complicated by ***.  She is ambulating, tolerating a regular diet, passing flatus, and urinating well. Patient is discharged home in stable  condition on 05/08/24.  Newborn Data: Birth date:05/08/2024 Birth time:4:11 PM Gender:Female Living status:Living Apgars:7 ,9  Weight:3550 g  Magnesium Sulfate received: {Mag received:30440022} BMZ received: {BMZ received:30440023} Rhophylac:{Rhophylac received:30440032} FFM:{FFM:69559966} T-DaP:{Tdap:23962} Flu: {Qol:76036} RSV Vaccine received: {RSV:31013} Transfusion:{Transfusion received:30440034}  Immunizations received: Immunization History  Administered Date(s) Administered   Influenza Inj Mdck Quad Pf 05/22/2021   Influenza, Mdck, Trivalent,PF 6+ MOS(egg free) 03/11/2023    Physical exam  Vitals:   05/08/24 1700 05/08/24 1716 05/08/24 1746 05/08/24 1801  BP: 123/65 116/67 127/72 124/73  Pulse: 85 75 75 80  Resp:      Temp:      TempSrc:      SpO2:      Weight:      Height:       General: {Exam; general:21111117} Lochia: {Desc; appropriate/inappropriate:30686::appropriate} Uterine Fundus: {Desc; firm/soft:30687} Incision: {Exam; incision:21111123} DVT Evaluation: {Exam; dvt:2111122} Labs: Lab Results  Component Value Date   WBC 7.1 05/08/2024   HGB 12.7 05/08/2024   HCT 37.4 05/08/2024   MCV 85.0 05/08/2024   PLT 224 05/08/2024      Latest Ref Rng & Units 10/16/2020    9:28 PM  CMP  Glucose 70 - 99 mg/dL 875   BUN 6 - 20 mg/dL 9   Creatinine 9.55 - 8.99 mg/dL 9.39   Sodium 864 - 854 mmol/L 133   Potassium 3.5 - 5.1 mmol/L 4.0   Chloride 98 - 111 mmol/L 100   CO2 22 - 32 mmol/L 24   Calcium 8.9 -  10.3 mg/dL 9.1    Edinburgh Score:     No data to display         No data recorded  After visit meds:  Allergies as of 05/08/2024   No Known Allergies   Med Rec must be completed prior to using this [facility]***        Discharge home in stable condition Infant Feeding: {Baby feeding:23562} Infant Disposition:{CHL IP OB HOME WITH FNUYZM:76418} Discharge instruction: per After Visit Summary and Postpartum booklet. Activity:  Advance as tolerated. Pelvic rest for 6 weeks.  Diet: {OB ipzu:78888878} Future Appointments: Future Appointments  Date Time Provider Department Center  05/10/2024 11:15 AM Fredirick Glenys RAMAN, MD [facility]  05/11/2024  4:00 PM [facility]  05/17/2024  9:55 AM Izell Harari, MD [facility]   Follow up Visit:   Please schedule this patient for a In person postpartum visit in 4 weeks with the following provider: Any provider. Additional Postpartum F/U:N/A   High risk pregnancy complicated by: AMA Delivery mode:  Vaginal, Spontaneous Anticipated Birth Control:  Unsure   05/08/2024 Claris CHRISTELLA Cedar, CNM

## 2024-05-08 NOTE — MAU Note (Signed)
 Destiny Cooper is a 43 y.o. at [redacted]w[redacted]d here in MAU reporting: contractions every 10-15 minutes. Contractions started this am and became more frequent about an hour ago.  Pt reports bloody show and + FM.    LMP: 08/08/23 Onset of complaint: am Pain score: 0 Vitals:   05/08/24 1413  BP: 132/74  Pulse: 81  Resp: 17  Temp: 98.1 F (36.7 C)  SpO2: 99%     FHT: 140  Lab orders placed from triage: none

## 2024-05-09 LAB — CBC
HCT: 33.7 % — ABNORMAL LOW (ref 36.0–46.0)
Hemoglobin: 11.3 g/dL — ABNORMAL LOW (ref 12.0–15.0)
MCH: 28.3 pg (ref 26.0–34.0)
MCHC: 33.5 g/dL (ref 30.0–36.0)
MCV: 84.5 fL (ref 80.0–100.0)
Platelets: 208 K/uL (ref 150–400)
RBC: 3.99 MIL/uL (ref 3.87–5.11)
RDW: 14 % (ref 11.5–15.5)
WBC: 7.8 K/uL (ref 4.0–10.5)
nRBC: 0 % (ref 0.0–0.2)

## 2024-05-09 LAB — RPR: RPR Ser Ql: NONREACTIVE

## 2024-05-09 NOTE — Progress Notes (Signed)
 POSTPARTUM PROGRESS NOTE  PPD #1  Subjective:  Destiny Cooper is a 43 y.o. H3E3993 s/p NSVD at [redacted]w[redacted]d. Today she notes she is doing well. She denies any problems with ambulating, voiding or po intake. Denies nausea or vomiting. She has passed flatus, no BM.  Pain is well controlled.  Lochia appropriate Denies fever/chills/chest pain/SOB.  no HA, no blurry vision, no RUQ pain  Objective: Blood pressure 99/63, pulse 70, temperature 97.8 F (36.6 C), temperature source Oral, resp. rate 18, height 5' 2 (1.575 m), weight 75.4 kg, last menstrual period 08/08/2023, SpO2 99%, unknown if currently breastfeeding.  Physical Exam:  General: alert, cooperative and no distress Chest: no respiratory distress Heart: regular rate and rhythm Abdomen: soft, nontender Uterine Fundus: firm, non- tender, below umbilicus DVT Evaluation: No calf swelling or tenderness Extremities: no edema Skin: warm, dry  Results for orders placed or performed during the hospital encounter of 05/08/24 (from the past 24 hours)  Type and screen     Status: None   Collection Time: 05/08/24  3:46 PM  Result Value Ref Range   ABO/RH(D) O POS    Antibody Screen NEG    Sample Expiration      05/11/2024,2359 Performed at [facility], [address], [place], [place] [address]   CBC     Status: None   Collection Time: 05/08/24  4:30 PM  Result Value Ref Range   WBC 7.1 4.0 - 10.5 K/uL   RBC 4.40 3.87 - 5.11 MIL/uL   Hemoglobin 12.7 12.0 - 15.0 g/dL   HCT 62.5 63.9 - 53.9 %   MCV 85.0 80.0 - 100.0 fL   MCH 28.9 26.0 - 34.0 pg   MCHC 34.0 30.0 - 36.0 g/dL   RDW 85.8 88.4 - 84.4 %   Platelets 224 150 - 400 K/uL   nRBC 0.0 0.0 - 0.2 %  RPR     Status: None   Collection Time: 05/08/24  4:30 PM  Result Value Ref Range   RPR Ser Ql NON REACTIVE NON REACTIVE  CBC     Status: Abnormal   Collection Time: 05/09/24  5:18 AM  Result Value Ref Range   WBC 7.8 4.0 - 10.5 K/uL   RBC 3.99 3.87 - 5.11 MIL/uL    Hemoglobin 11.3 (L) 12.0 - 15.0 g/dL   HCT 66.2 (L) 63.9 - 53.9 %   MCV 84.5 80.0 - 100.0 fL   MCH 28.3 26.0 - 34.0 pg   MCHC 33.5 30.0 - 36.0 g/dL   RDW 85.9 88.4 - 84.4 %   Platelets 208 150 - 400 K/uL   nRBC 0.0 0.0 - 0.2 %    Assessment/Plan: Jeneal Vogl is a 43 y.o. H3E3993 s/p NSVD at [redacted]w[redacted]d PPD#1 -pain well controlled -meeting milestones appropriately  Contraception: considering Nexplanon Feeding: breast/bottle  Dispo: Routine postpartum care, plan for discharge home tomorrow   LOS: 1 day   Vallarie Fei, DO Faculty Attending, Center for [facility] 05/09/2024, 11:43 AM

## 2024-05-09 NOTE — Lactation Note (Signed)
 This note was copied from a baby's chart. Lactation Consultation Note  Patient Name: Girl Geniene List Unijb'd Date: 05/09/2024 Age:42 hours   Mom declines Lactation.  Maternal Data    Feeding    LATCH Score                    Lactation Tools Discussed/Used    Interventions    Discharge    Consult Status Consult Status: Complete    Cem Kosman G 05/09/2024, 6:36 AM

## 2024-05-10 ENCOUNTER — Encounter: Payer: Self-pay | Admitting: Family Medicine

## 2024-05-10 MED ORDER — ACETAMINOPHEN 325 MG PO TABS
650.0000 mg | ORAL_TABLET | ORAL | Status: AC | PRN
Start: 2024-05-10 — End: ?

## 2024-05-10 MED ORDER — SENNOSIDES-DOCUSATE SODIUM 8.6-50 MG PO TABS: 2.0000 | ORAL_TABLET | Freq: Every day | ORAL | 0 refills | Status: AC

## 2024-05-10 MED ORDER — IBUPROFEN 600 MG PO TABS: 600.0000 mg | ORAL_TABLET | Freq: Four times a day (QID) | ORAL | 1 refills | Status: AC | PRN

## 2024-05-10 NOTE — Plan of Care (Signed)
 Problem: Education: Goal: Knowledge of condition will improve 05/10/2024 0901 by Madison Rosina LABOR, LPN Outcome: Adequate for Discharge 05/10/2024 0815 by Madison Rosina LABOR, LPN Outcome: Progressing Goal: Individualized Educational Video(s) 05/10/2024 0901 by Madison Rosina LABOR, LPN Outcome: Adequate for Discharge 05/10/2024 0815 by Madison Rosina LABOR, LPN Outcome: Progressing Goal: Individualized Newborn Educational Video(s) 05/10/2024 0901 by Madison Rosina LABOR, LPN Outcome: Adequate for Discharge 05/10/2024 0815 by Madison Rosina LABOR, LPN Outcome: Progressing   Problem: Activity: Goal: Will verbalize the importance of balancing activity with adequate rest periods 05/10/2024 0901 by Madison Rosina LABOR, LPN Outcome: Adequate for Discharge 05/10/2024 0815 by Madison Rosina LABOR, LPN Outcome: Progressing Goal: Ability to tolerate increased activity will improve 05/10/2024 0901 by Madison Rosina LABOR, LPN Outcome: Adequate for Discharge 05/10/2024 0815 by Madison Rosina LABOR, LPN Outcome: Progressing   Problem: Coping: Goal: Ability to identify and utilize available resources and services will improve 05/10/2024 0901 by Madison Rosina LABOR, LPN Outcome: Adequate for Discharge 05/10/2024 0815 by Madison Rosina LABOR, LPN Outcome: Progressing   Problem: Life Cycle: Goal: Chance of risk for complications during the postpartum period will decrease 05/10/2024 0901 by Madison Rosina LABOR, LPN Outcome: Adequate for Discharge 05/10/2024 0815 by Madison Rosina LABOR, LPN Outcome: Progressing   Problem: Role Relationship: Goal: Ability to demonstrate positive interaction with newborn will improve 05/10/2024 0901 by Madison Rosina LABOR, LPN Outcome: Adequate for Discharge 05/10/2024 0815 by Madison Rosina LABOR, LPN Outcome: Progressing   Problem: Skin Integrity: Goal: Demonstration of wound healing without infection will improve 05/10/2024 0901 by Madison Rosina LABOR, LPN Outcome: Adequate for Discharge 05/10/2024 0815 by Madison Rosina LABOR,  LPN Outcome: Progressing   Problem: Education: Goal: Knowledge of condition will improve 05/10/2024 0901 by Madison Rosina LABOR, LPN Outcome: Adequate for Discharge 05/10/2024 0815 by Madison Rosina LABOR, LPN Outcome: Progressing Goal: Individualized Educational Video(s) 05/10/2024 0901 by Madison Rosina LABOR, LPN Outcome: Adequate for Discharge 05/10/2024 0815 by Madison Rosina LABOR, LPN Outcome: Progressing Goal: Individualized Newborn Educational Video(s) 05/10/2024 0901 by Madison Rosina LABOR, LPN Outcome: Adequate for Discharge 05/10/2024 0815 by Madison Rosina LABOR, LPN Outcome: Progressing   Problem: Activity: Goal: Will verbalize the importance of balancing activity with adequate rest periods 05/10/2024 0901 by Madison Rosina LABOR, LPN Outcome: Adequate for Discharge 05/10/2024 0815 by Madison Rosina LABOR, LPN Outcome: Progressing Goal: Ability to tolerate increased activity will improve 05/10/2024 0901 by Madison Rosina LABOR, LPN Outcome: Adequate for Discharge 05/10/2024 0815 by Madison Rosina LABOR, LPN Outcome: Progressing   Problem: Coping: Goal: Ability to identify and utilize available resources and services will improve 05/10/2024 0901 by Madison Rosina LABOR, LPN Outcome: Adequate for Discharge 05/10/2024 0815 by Madison Rosina LABOR, LPN Outcome: Progressing   Problem: Life Cycle: Goal: Chance of risk for complications during the postpartum period will decrease 05/10/2024 0901 by Madison Rosina LABOR, LPN Outcome: Adequate for Discharge 05/10/2024 0815 by Madison Rosina LABOR, LPN Outcome: Progressing   Problem: Role Relationship: Goal: Ability to demonstrate positive interaction with newborn will improve 05/10/2024 0901 by Madison Rosina LABOR, LPN Outcome: Adequate for Discharge 05/10/2024 0815 by Madison Rosina LABOR, LPN Outcome: Progressing   Problem: Skin Integrity: Goal: Demonstration of wound healing without infection will improve 05/10/2024 0901 by Madison Rosina LABOR, LPN Outcome: Adequate for Discharge 05/10/2024 0815 by  Madison Rosina LABOR, LPN Outcome: Progressing   Problem: Education: Goal: Knowledge of General Education information will improve Description: Including pain rating scale, medication(s)/side effects and non-pharmacologic comfort measures 05/10/2024 0901 by Madison Rosina LABOR, LPN  Outcome: Adequate for Discharge 05/10/2024 0815 by Madison Rosina LABOR, LPN Outcome: Progressing   Problem: Health Behavior/Discharge Planning: Goal: Ability to manage health-related needs will improve 05/10/2024 0901 by Madison Rosina LABOR, LPN Outcome: Adequate for Discharge 05/10/2024 0815 by Madison Rosina LABOR, LPN Outcome: Progressing   Problem: Clinical Measurements: Goal: Ability to maintain clinical measurements within normal limits will improve 05/10/2024 0901 by Madison Rosina LABOR, LPN Outcome: Adequate for Discharge 05/10/2024 0815 by Madison Rosina LABOR, LPN Outcome: Progressing Goal: Will remain free from infection 05/10/2024 0901 by Madison Rosina LABOR, LPN Outcome: Adequate for Discharge 05/10/2024 0815 by Madison Rosina LABOR, LPN Outcome: Progressing Goal: Diagnostic test results will improve 05/10/2024 0901 by Madison Rosina LABOR, LPN Outcome: Adequate for Discharge 05/10/2024 0815 by Madison Rosina LABOR, LPN Outcome: Progressing Goal: Respiratory complications will improve 05/10/2024 0901 by Madison Rosina LABOR, LPN Outcome: Adequate for Discharge 05/10/2024 0815 by Madison Rosina LABOR, LPN Outcome: Progressing Goal: Cardiovascular complication will be avoided 05/10/2024 0901 by Madison Rosina LABOR, LPN Outcome: Adequate for Discharge 05/10/2024 0815 by Madison Rosina LABOR, LPN Outcome: Progressing   Problem: Activity: Goal: Risk for activity intolerance will decrease 05/10/2024 0901 by Madison Rosina LABOR, LPN Outcome: Adequate for Discharge 05/10/2024 0815 by Madison Rosina LABOR, LPN Outcome: Progressing   Problem: Nutrition: Goal: Adequate nutrition will be maintained 05/10/2024 0901 by Madison Rosina LABOR, LPN Outcome: Adequate for  Discharge 05/10/2024 0815 by Madison Rosina LABOR, LPN Outcome: Progressing   Problem: Coping: Goal: Level of anxiety will decrease 05/10/2024 0901 by Madison Rosina LABOR, LPN Outcome: Adequate for Discharge 05/10/2024 0815 by Madison Rosina LABOR, LPN Outcome: Progressing   Problem: Elimination: Goal: Will not experience complications related to bowel motility 05/10/2024 0901 by Madison Rosina LABOR, LPN Outcome: Adequate for Discharge 05/10/2024 0815 by Madison Rosina LABOR, LPN Outcome: Progressing Goal: Will not experience complications related to urinary retention 05/10/2024 0901 by Madison Rosina LABOR, LPN Outcome: Adequate for Discharge 05/10/2024 0815 by Madison Rosina LABOR, LPN Outcome: Progressing   Problem: Pain Managment: Goal: General experience of comfort will improve and/or be controlled 05/10/2024 0901 by Madison Rosina LABOR, LPN Outcome: Adequate for Discharge 05/10/2024 0815 by Madison Rosina LABOR, LPN Outcome: Progressing   Problem: Safety: Goal: Ability to remain free from injury will improve 05/10/2024 0901 by Madison Rosina LABOR, LPN Outcome: Adequate for Discharge 05/10/2024 0815 by Madison Rosina LABOR, LPN Outcome: Progressing   Problem: Skin Integrity: Goal: Risk for impaired skin integrity will decrease 05/10/2024 0901 by Madison Rosina LABOR, LPN Outcome: Adequate for Discharge 05/10/2024 0815 by Madison Rosina LABOR, LPN Outcome: Progressing

## 2024-05-10 NOTE — Discharge Instructions (Signed)

## 2024-05-10 NOTE — Patient Instructions (Signed)

## 2024-05-10 NOTE — Plan of Care (Signed)
  Problem: Education: Goal: Knowledge of condition will improve Outcome: Progressing Goal: Individualized Educational Video(s) Outcome: Progressing Goal: Individualized Newborn Educational Video(s) Outcome: Progressing   Problem: Activity: Goal: Will verbalize the importance of balancing activity with adequate rest periods Outcome: Progressing Goal: Ability to tolerate increased activity will improve Outcome: Progressing   Problem: Coping: Goal: Ability to identify and utilize available resources and services will improve Outcome: Progressing   Problem: Life Cycle: Goal: Chance of risk for complications during the postpartum period will decrease Outcome: Progressing   Problem: Role Relationship: Goal: Ability to demonstrate positive interaction with newborn will improve Outcome: Progressing   Problem: Skin Integrity: Goal: Demonstration of wound healing without infection will improve Outcome: Progressing   Problem: Education: Goal: Knowledge of condition will improve Outcome: Progressing Goal: Individualized Educational Video(s) Outcome: Progressing Goal: Individualized Newborn Educational Video(s) Outcome: Progressing   Problem: Activity: Goal: Will verbalize the importance of balancing activity with adequate rest periods Outcome: Progressing Goal: Ability to tolerate increased activity will improve Outcome: Progressing   Problem: Coping: Goal: Ability to identify and utilize available resources and services will improve Outcome: Progressing   Problem: Life Cycle: Goal: Chance of risk for complications during the postpartum period will decrease Outcome: Progressing   Problem: Role Relationship: Goal: Ability to demonstrate positive interaction with newborn will improve Outcome: Progressing   Problem: Skin Integrity: Goal: Demonstration of wound healing without infection will improve Outcome: Progressing   Problem: Education: Goal: Knowledge of General  Education information will improve Description: Including pain rating scale, medication(s)/side effects and non-pharmacologic comfort measures Outcome: Progressing   Problem: Health Behavior/Discharge Planning: Goal: Ability to manage health-related needs will improve Outcome: Progressing   Problem: Clinical Measurements: Goal: Ability to maintain clinical measurements within normal limits will improve Outcome: Progressing Goal: Will remain free from infection Outcome: Progressing Goal: Diagnostic test results will improve Outcome: Progressing Goal: Respiratory complications will improve Outcome: Progressing Goal: Cardiovascular complication will be avoided Outcome: Progressing   Problem: Activity: Goal: Risk for activity intolerance will decrease Outcome: Progressing   Problem: Nutrition: Goal: Adequate nutrition will be maintained Outcome: Progressing   Problem: Coping: Goal: Level of anxiety will decrease Outcome: Progressing   Problem: Elimination: Goal: Will not experience complications related to bowel motility Outcome: Progressing Goal: Will not experience complications related to urinary retention Outcome: Progressing   Problem: Pain Managment: Goal: General experience of comfort will improve and/or be controlled Outcome: Progressing   Problem: Safety: Goal: Ability to remain free from injury will improve Outcome: Progressing   Problem: Skin Integrity: Goal: Risk for impaired skin integrity will decrease Outcome: Progressing

## 2024-05-11 ENCOUNTER — Inpatient Hospital Stay (HOSPITAL_COMMUNITY): Admission: RE | Admit: 2024-05-11 | Payer: Self-pay | Source: Home / Self Care | Admitting: Family Medicine

## 2024-05-11 ENCOUNTER — Inpatient Hospital Stay (HOSPITAL_COMMUNITY): Payer: Self-pay

## 2024-05-11 ENCOUNTER — Ambulatory Visit: Payer: Self-pay

## 2024-05-17 ENCOUNTER — Encounter: Payer: Self-pay | Admitting: Obstetrics and Gynecology

## 2024-05-19 ENCOUNTER — Telehealth (HOSPITAL_COMMUNITY): Payer: Self-pay | Admitting: *Deleted

## 2024-05-19 NOTE — Telephone Encounter (Signed)
 05/19/2024  Name: Destiny Cooper MRN: [id number] DOB: October 21, 1980  Reason for Call:  Transition of Care Hospital Discharge Call  Contact Status: Patient Contact Status: Message  Language assistant needed: Interpreter Mode: Telephonic Interpreter Interpreter Name: Sherlean [phone]        Follow-Up Questions:    Van Postnatal Depression Scale:  In the Past 7 Days:    PHQ2-9 Depression Scale:     Discharge Follow-up:    Post-discharge interventions: NA  Mliss Sieve, RN 05/19/2024 15:47
